# Patient Record
Sex: Female | Born: 1995
Health system: Southern US, Community
[De-identification: ages and names within clinical notes are randomized; demographics above are authoritative.]

## PROBLEM LIST (undated history)

## (undated) DIAGNOSIS — K8020 Calculus of gallbladder without cholecystitis without obstruction: Secondary | ICD-10-CM

## (undated) DIAGNOSIS — O24419 Gestational diabetes mellitus in pregnancy, unspecified control: Secondary | ICD-10-CM

## (undated) HISTORY — DX: Gestational diabetes mellitus in pregnancy, unspecified control: O24.419

## (undated) HISTORY — DX: Calculus of gallbladder without cholecystitis without obstruction: K80.20

## (undated) HISTORY — PX: WISDOM TOOTH EXTRACTION: SHX21

---

## 2015-05-06 ENCOUNTER — Emergency Department (HOSPITAL_COMMUNITY): Payer: Managed Care, Other (non HMO)

## 2015-05-06 ENCOUNTER — Encounter (HOSPITAL_COMMUNITY): Payer: Self-pay | Admitting: Emergency Medicine

## 2015-05-06 ENCOUNTER — Inpatient Hospital Stay (HOSPITAL_COMMUNITY)
Admission: EM | Admit: 2015-05-06 | Discharge: 2015-05-10 | DRG: 439 | Disposition: A | Discharge status: Home / Self Care | Payer: Managed Care, Other (non HMO) | Attending: Student in an Organized Health Care Education/Training Program | Admitting: Student in an Organized Health Care Education/Training Program

## 2015-05-06 DIAGNOSIS — R7989 Other specified abnormal findings of blood chemistry: Secondary | ICD-10-CM

## 2015-05-06 DIAGNOSIS — R945 Abnormal results of liver function studies: Secondary | ICD-10-CM

## 2015-05-06 DIAGNOSIS — N3001 Acute cystitis with hematuria: Secondary | ICD-10-CM

## 2015-05-06 DIAGNOSIS — R17 Unspecified jaundice: Secondary | ICD-10-CM

## 2015-05-06 DIAGNOSIS — R000 Tachycardia, unspecified: Secondary | ICD-10-CM

## 2015-05-06 DIAGNOSIS — K851 Biliary acute pancreatitis without necrosis or infection: Secondary | ICD-10-CM | POA: Diagnosis not present

## 2015-05-06 DIAGNOSIS — B964 Proteus (mirabilis) (morganii) as the cause of diseases classified elsewhere: Secondary | ICD-10-CM | POA: Diagnosis present

## 2015-05-06 DIAGNOSIS — R0682 Tachypnea, not elsewhere classified: Secondary | ICD-10-CM

## 2015-05-06 DIAGNOSIS — K859 Acute pancreatitis without necrosis or infection, unspecified: Secondary | ICD-10-CM

## 2015-05-06 DIAGNOSIS — K8050 Calculus of bile duct without cholangitis or cholecystitis without obstruction: Secondary | ICD-10-CM

## 2015-05-06 DIAGNOSIS — R748 Abnormal levels of other serum enzymes: Secondary | ICD-10-CM

## 2015-05-06 DIAGNOSIS — R509 Fever, unspecified: Secondary | ICD-10-CM

## 2015-05-06 DIAGNOSIS — K858 Other acute pancreatitis without necrosis or infection: Principal | ICD-10-CM | POA: Diagnosis present

## 2015-05-06 LAB — CBC WITH DIFFERENTIAL/PLATELET
BASOS ABS: 0 10*3/uL (ref 0.0–0.1)
BASOS PCT: 0 %
EOS PCT: 0 %
Eosinophils Absolute: 0 10*3/uL (ref 0.0–0.7)
HEMATOCRIT: 34 % — AB (ref 36.0–46.0)
HEMOGLOBIN: 12.3 g/dL (ref 12.0–15.0)
LYMPHS PCT: 9 %
Lymphs Abs: 1.2 10*3/uL (ref 0.7–4.0)
MCH: 34.3 pg — ABNORMAL HIGH (ref 26.0–34.0)
MCHC: 36.2 g/dL — AB (ref 30.0–36.0)
MCV: 94.7 fL (ref 78.0–100.0)
MONOS PCT: 10 %
Monocytes Absolute: 1.4 10*3/uL — ABNORMAL HIGH (ref 0.1–1.0)
NEUTROS ABS: 11.2 10*3/uL — AB (ref 1.7–7.7)
Neutrophils Relative %: 81 %
Platelets: 255 10*3/uL (ref 150–400)
RBC: 3.59 MIL/uL — ABNORMAL LOW (ref 3.87–5.11)
RDW: 11.3 % — ABNORMAL LOW (ref 11.5–15.5)
WBC: 13.8 10*3/uL — ABNORMAL HIGH (ref 4.0–10.5)

## 2015-05-06 LAB — COMPREHENSIVE METABOLIC PANEL
ALBUMIN: 2.7 g/dL — AB (ref 3.5–5.0)
ALK PHOS: 129 U/L — AB (ref 38–126)
ALT: 92 U/L — ABNORMAL HIGH (ref 14–54)
ANION GAP: 9 (ref 5–15)
AST: 74 U/L — ABNORMAL HIGH (ref 15–41)
BILIRUBIN TOTAL: 5.8 mg/dL — AB (ref 0.3–1.2)
BUN: 6 mg/dL (ref 6–20)
CALCIUM: 8.9 mg/dL (ref 8.9–10.3)
CO2: 23 mmol/L (ref 22–32)
Chloride: 104 mmol/L (ref 101–111)
Creatinine, Ser: 0.96 mg/dL (ref 0.44–1.00)
GFR calc Af Amer: >60 mL/min (ref >60)
GFR calc non Af Amer: >60 mL/min (ref >60)
GLUCOSE: 109 mg/dL — AB (ref 65–99)
Potassium: 3 mmol/L — ABNORMAL LOW (ref 3.5–5.1)
Sodium: 136 mmol/L (ref 135–145)
Total Protein: 7.6 g/dL (ref 6.5–8.1)

## 2015-05-06 LAB — URINALYSIS, ROUTINE W REFLEX MICROSCOPIC
GLUCOSE, UA: NEGATIVE mg/dL
Ketones, ur: 40 mg/dL — AB
Nitrite: POSITIVE — AB
PH: 5.5 (ref 5.0–8.0)
Protein, ur: >300 mg/dL — AB

## 2015-05-06 LAB — URINE MICROSCOPIC-ADD ON

## 2015-05-06 LAB — I-STAT CG4 LACTIC ACID, ED
Lactic Acid, Venous: 0.52 mmol/L (ref 0.5–2.0)
Lactic Acid, Venous: 0.68 mmol/L (ref 0.5–2.0)

## 2015-05-06 LAB — LIPID PANEL
CHOL/HDL RATIO: 5 ratio
Cholesterol: 149 mg/dL (ref 0–200)
HDL: 30 mg/dL — ABNORMAL LOW (ref >40)
LDL CALC: 104 mg/dL — AB (ref 0–99)
Triglycerides: 76 mg/dL (ref <150)
VLDL: 15 mg/dL (ref 0–40)

## 2015-05-06 LAB — MRSA PCR SCREENING: MRSA by PCR: NEGATIVE

## 2015-05-06 LAB — LIPASE, BLOOD: Lipase: 886 U/L — ABNORMAL HIGH (ref 11–51)

## 2015-05-06 LAB — INFLUENZA PANEL BY PCR (TYPE A & B)
H1N1FLUPCR: NOT DETECTED
INFLBPCR: NEGATIVE
Influenza A By PCR: NEGATIVE

## 2015-05-06 LAB — POC URINE PREG, ED: Preg Test, Ur: NEGATIVE

## 2015-05-06 MED ORDER — ONDANSETRON HCL 4 MG/2ML IJ SOLN
4.0000 mg | Freq: Once | INTRAMUSCULAR | Status: AC
  Administered 2015-05-06: 4 mg via INTRAVENOUS
  Filled 2015-05-06: qty 2

## 2015-05-06 MED ORDER — ONDANSETRON 4 MG PO TBDP
ORAL_TABLET | ORAL | Status: AC
  Filled 2015-05-06: qty 1

## 2015-05-06 MED ORDER — DEXTROSE 5 % IV SOLN
1.0000 g | INTRAVENOUS | Status: DC
  Administered 2015-05-07 – 2015-05-08 (×2): 1 g via INTRAVENOUS
  Filled 2015-05-06 (×4): qty 10

## 2015-05-06 MED ORDER — HYDROMORPHONE HCL 1 MG/ML IJ SOLN
0.2500 mg | INTRAMUSCULAR | Status: DC | PRN
  Administered 2015-05-06 – 2015-05-09 (×11): 0.25 mg via INTRAVENOUS
  Filled 2015-05-06 (×11): qty 1

## 2015-05-06 MED ORDER — POTASSIUM CHLORIDE 10 MEQ/100ML IV SOLN
10.0000 meq | INTRAVENOUS | Status: DC
  Administered 2015-05-06 (×4): 10 meq via INTRAVENOUS
  Filled 2015-05-06 (×5): qty 100

## 2015-05-06 MED ORDER — ACETAMINOPHEN 650 MG RE SUPP: 325.0000 mg | RECTAL | Status: DC | PRN

## 2015-05-06 MED ORDER — ONDANSETRON HCL 4 MG/2ML IJ SOLN
4.0000 mg | Freq: Four times a day (QID) | INTRAMUSCULAR | Status: DC | PRN
  Administered 2015-05-06 – 2015-05-08 (×2): 4 mg via INTRAVENOUS
  Filled 2015-05-06 (×3): qty 2

## 2015-05-06 MED ORDER — ONDANSETRON HCL 4 MG PO TABS
4.0000 mg | ORAL_TABLET | Freq: Four times a day (QID) | ORAL | Status: DC | PRN
  Administered 2015-05-09: 4 mg via ORAL
  Filled 2015-05-06: qty 1

## 2015-05-06 MED ORDER — ENOXAPARIN SODIUM 40 MG/0.4ML ~~LOC~~ SOLN: 40.0000 mg | SUBCUTANEOUS | Status: DC

## 2015-05-06 MED ORDER — SODIUM CHLORIDE 0.9 % IV BOLUS (SEPSIS)
1000.0000 mL | Freq: Once | INTRAVENOUS | Status: AC
  Administered 2015-05-06: 1000 mL via INTRAVENOUS

## 2015-05-06 MED ORDER — DEXTROSE 5 % IV SOLN
1.0000 g | Freq: Once | INTRAVENOUS | Status: AC
  Administered 2015-05-06: 1 g via INTRAVENOUS
  Filled 2015-05-06: qty 10

## 2015-05-06 MED ORDER — SODIUM CHLORIDE 0.9 % IV BOLUS (SEPSIS)
500.0000 mL | Freq: Once | INTRAVENOUS | Status: AC
  Administered 2015-05-06: 500 mL via INTRAVENOUS

## 2015-05-06 MED ORDER — MORPHINE SULFATE (PF) 4 MG/ML IV SOLN
4.0000 mg | Freq: Once | INTRAVENOUS | Status: AC
  Administered 2015-05-06: 4 mg via INTRAVENOUS
  Filled 2015-05-06: qty 1

## 2015-05-06 MED ORDER — ONDANSETRON 4 MG PO TBDP
4.0000 mg | ORAL_TABLET | Freq: Once | ORAL | Status: AC
  Administered 2015-05-06: 4 mg via ORAL

## 2015-05-06 MED ORDER — SODIUM CHLORIDE 0.9 % IV SOLN
INTRAVENOUS | Status: DC
  Administered 2015-05-06: 14:00:00 via INTRAVENOUS

## 2015-05-06 MED ORDER — HYDROMORPHONE HCL 1 MG/ML IJ SOLN
1.0000 mg | Freq: Once | INTRAMUSCULAR | Status: AC
  Administered 2015-05-06: 1 mg via INTRAVENOUS
  Filled 2015-05-06: qty 1

## 2015-05-06 MED ORDER — KETOROLAC TROMETHAMINE 15 MG/ML IJ SOLN
15.0000 mg | Freq: Four times a day (QID) | INTRAMUSCULAR | Status: DC | PRN
  Administered 2015-05-07 – 2015-05-09 (×3): 15 mg via INTRAVENOUS
  Filled 2015-05-06 (×6): qty 1

## 2015-05-06 MED ORDER — IOHEXOL 300 MG/ML  SOLN
100.0000 mL | Freq: Once | INTRAMUSCULAR | Status: AC | PRN
  Administered 2015-05-06: 100 mL via INTRAVENOUS

## 2015-05-06 NOTE — ED Provider Notes (Signed)
CSN: [id number]     Arrival date & time 05/06/15  0027 History   By signing my name below, I, Arlan Organ, attest that this documentation has been prepared under the direction and in the presence of Gilda Crease, MD.  Electronically Signed: Arlan Organ, ED Scribe. 05/06/2015. 3:04 AM.   Chief Complaint  Patient presents with  . Emesis   The history is provided by the patient. No language interpreter was used.    HPI Comments: Emily Mann is a 19 y.o. female without any pertinent past medical history who presents to the Emergency Department complaining of constant, ongoing nausea and vomiting x 2 days. Pt states she is unable to keep any solid foods or liquids down at this time. She also reports associated abdominal pain and diarrhea. No aggravating or alleviating factors at this time. No OTC medications or home remedies attempted prior to arrival. No recent fever, chills, chest pain, or shortness of breath. She denies any dysuria or hematuria. No recent known sick contacts. No previous history of abdominal surgeries.  PCP: No primary care provider on file.    History reviewed. No pertinent past medical history. Past Surgical History  Procedure Laterality Date  . Wisdom tooth extraction     No family history on file. Social History  Substance Use Topics  . Smoking status: Never Smoker   . Smokeless tobacco: None  . Alcohol Use: No   OB History    No data available     Review of Systems  Constitutional: Negative for fever and chills.  Respiratory: Negative for cough and shortness of breath.   Cardiovascular: Negative for chest pain.  Gastrointestinal: Positive for nausea, vomiting, abdominal pain and diarrhea.  Genitourinary: Negative for dysuria and hematuria.  Musculoskeletal: Negative for back pain.  Neurological: Negative for headaches.  Psychiatric/Behavioral: Negative for confusion.  All other systems reviewed and are negative.     Allergies  Review  of patient's allergies indicates not on file.  Home Medications   Prior to Admission medications   Not on File   Triage Vitals: BP 112/73 mmHg  Pulse 86  Temp(Src) 99.3 F (37.4 C)  Resp 20  Ht  (1.6 m)  Wt 257 lb (116.574 kg)  BMI 45.54 kg/m2  SpO2 96%  LMP 04/06/2015   Physical Exam  Constitutional: She is oriented to person, place, and time. She appears well-developed and well-nourished. No distress.  HENT:  Head: Normocephalic and atraumatic.  Right Ear: Hearing normal.  Left Ear: Hearing normal.  Nose: Nose normal.  Mouth/Throat: Oropharynx is clear and moist and mucous membranes are normal.  Eyes: Conjunctivae and EOM are normal. Pupils are equal, round, and reactive to light.  Neck: Normal range of motion. Neck supple.  Cardiovascular: Regular rhythm, S1 normal and S2 normal.  Exam reveals no gallop and no friction rub.   No murmur heard. Pulmonary/Chest: Effort normal and breath sounds normal. No respiratory distress. She exhibits no tenderness.  Abdominal: Soft. Normal appearance and bowel sounds are normal. There is no hepatosplenomegaly. There is tenderness. There is no rebound, no guarding, no tenderness at McBurney's point and negative Murphy's sign. No hernia.  Tenderness to RUQ and epigastrium   Musculoskeletal: Normal range of motion.  Neurological: She is alert and oriented to person, place, and time. She has normal strength. No cranial nerve deficit or sensory deficit. Coordination normal. GCS eye subscore is 4. GCS verbal subscore is 5. GCS motor subscore is 6.  Skin: Skin is warm,  dry and intact. No rash noted. No cyanosis.  Psychiatric: She has a normal mood and affect. Her speech is normal and behavior is normal. Thought content normal.  Nursing note and vitals reviewed.   ED Course  Procedures (including critical care time)  DIAGNOSTIC STUDIES: Oxygen Saturation is 96% on RA, adequate by my interpretation.    COORDINATION OF CARE: 2:53 AM-  Will give fluids and Zofran. Will order DG abd acute with chest, US abdomen limited RUQ, CBC, CMP, urinalysis, and urine pregnancy. Discussed treatment plan with pt at bedside and pt agreed to plan.     Labs Review Labs Reviewed  CBC WITH DIFFERENTIAL/PLATELET - Abnormal; Notable for the following:    WBC 13.8 (*)    RBC 3.59 (*)    HCT 34.0 (*)    MCH 34.3 (*)    MCHC 36.2 (*)    RDW 11.3 (*)    Neutro Abs 11.2 (*)    Monocytes Absolute 1.4 (*)    All other components within normal limits  COMPREHENSIVE METABOLIC PANEL - Abnormal; Notable for the following:    Potassium 3.0 (*)    Glucose, Bld 109 (*)    Albumin 2.7 (*)    AST 74 (*)    ALT 92 (*)    Alkaline Phosphatase 129 (*)    Total Bilirubin 5.8 (*)    All other components within normal limits  URINALYSIS, ROUTINE W REFLEX MICROSCOPIC (NOT AT [facility]) - Abnormal; Notable for the following:    Color, Urine RED (*)    APPearance TURBID (*)    Specific Gravity, Urine >1.046 (*)    Hgb urine dipstick LARGE (*)    Bilirubin Urine LARGE (*)    Ketones, ur 40 (*)    Protein, ur >300 (*)    Nitrite POSITIVE (*)    Leukocytes, UA MODERATE (*)    All other components within normal limits  URINE MICROSCOPIC-ADD ON - Abnormal; Notable for the following:    Squamous Epithelial / LPF 6-30 (*)    Bacteria, UA MANY (*)    Casts GRANULAR CAST (*)    All other components within normal limits  POC URINE PREG, ED    Imaging Review No results found. I have personally reviewed and evaluated these images and lab results as part of my medical decision-making.   EKG Interpretation None      MDM   Final diagnoses:  None   abdominal pain, nausea, vomiting  Presents to the emergency department for evaluation of abdominal pain, nausea, vomiting, diarrhea. Patient reported diffuse abdominal pain in the periumbilical region initially. Examination, however, revealed diffuse tenderness without focality in the right upper quadrant. Patient  was also found to have abnormal LFTs. Ultrasound performed. There is evidence of possible intraductal dilatation without obvious extra ductal dilatation. CT scan recommended. Patient will undergo CT scan, case signed out to oncoming ER physician to follow-up on CT and disposition patient.  I personally performed the services described in this documentation, which was scribed in my presence. The recorded information has been reviewed and is accurate.    Gilda Creasehristopher J Dorisann Schwanke, MD 05/06/15 [phone]

## 2015-05-06 NOTE — ED Notes (Signed)
Pt has temp 103.3 oral. Krall,MD notified.

## 2015-05-06 NOTE — ED Notes (Signed)
This RN called Dr. Isabella BowensKrall to advise pt's HR trending up to 140's.  New order received for 1L NS bolus.

## 2015-05-06 NOTE — H&P (Signed)
Date: 05/06/2015               Patient Name:  Emily Mann MRN: [id number]  DOB: [birth year] Age / Sex: 19 y.o., female   PCP: No primary care provider on file.         Medical Service: Internal Medicine Teaching Service         Attending Physician: Dr. Tyson Aliasuncan Thomas Vincent, MD    First Contact: Dr. Reubin MilanBilly Marquia Costello Pager: [phone]  Second Contact: Dr. Griffin BasilJennifer Krall Pager: [phone]       After Hours (After 5p/  First Contact Pager: [phone]  weekends / holidays): Second Contact Pager: [phone]   Chief Complaint: Abdominal pain and vomiting  History of Present Illness: Emily Mann is a 19yo F with no significant PMHx who presents with a 2-day history of generalized crampy abdominal pain with vomiting. She was in her usual state of health until 4 days ago, when she started having URI-like symptoms with subjective fevers, occasionally productive cough with chest congestion, and nausea. Then, 2 days ago, she started having worsening generalized crampy abdominal pain with nausea, vomiting, and diarrhea without association with meals, which did not remit and brought her to the ED. She has not tried anything OTC for her symptoms, and nothing seems to worsen or improve the symptoms. She also has generalized malaise. Otherwise, she denies other symptoms, such as diaphoresis, vision changes, headache, chest pain, shortness of breath, palpitations, abdominal distension, rashes, leg swelling, numbness, weakness, changes in urination, rhinorrhea, sore throat, sick contacts, recent travel, or other issues at this time. Her last menstrual period was 4 weeks ago, cycles are regular q 4-5 weeks. She is not taking OCPs or any other medicines.  Meds: No current facility-administered medications for this encounter.   No current outpatient prescriptions on file.    Allergies: Allergies as of 05/06/2015  . (No Known Allergies)   History reviewed. No pertinent past medical history. Past Surgical History    Procedure Laterality Date  . Wisdom tooth extraction     Family History  Problem Relation Age of Onset  . Liver cancer Maternal Grandmother   . Liver disease Brother    Social History   Social History  . Marital Status: Single    Spouse Name: N/A  . Number of Children: N/A  . Years of Education: N/A   Occupational History  . Product managerrinting press worker    Social History Main Topics  . Smoking status: Never Smoker   . Smokeless tobacco: Never Used  . Alcohol Use: 0.6 - 1.2 oz/week    1-2 Standard drinks or equivalent per week  . Drug Use: No  . Sexual Activity:    Partners: Female   Other Topics Concern  . Not on file   Social History Narrative    Review of Systems: Pertinent items noted in HPI and 10 systems reviewed with remainder of comprehensive ROS otherwise negative.  Physical Exam: Blood pressure 132/78, pulse 131, temperature 102.4 F (39.1 C), temperature source Rectal, resp. rate 20, height 5\' 3"  (1.6 m), weight 257 lb (116.574 kg), last menstrual period 04/06/2015, SpO2 99 %.   Gen: Well-appearing, alert and oriented to person, place, and time HEENT: Oropharynx clear without erythema or exudate.  Neck: No cervical LAD, no thyromegaly or nodules, no JVD noted. CV: Normal rate, regular rhythm, no murmurs, rubs, or gallops Pulmonary: Normal effort, CTA bilaterally, no wheezing, rales, or rhonchi Abdominal: Soft abdomen, moderate tenderness in the epigastrum and RUQ, no rebound,  guarding or masses, Murphy's negative. Extremities: Distal pulses 2+ in upper and lower extremities bilaterally, no tenderness, erythema or edema Skin: No atypical appearing moles. No rashes   Lab results: Basic Metabolic Panel:  Recent Labs  05/06/15 0055 16/02/9635  K 3.0*  CL 104  CO2 23  GLUCOSE 109*  BUN 6  CREATININE 0.96  CALCIUM 8.9   Liver Function Tests:  Recent Labs  05/06/15 0055  AST 74*  ALT 92*  ALKPHOS 129*  BILITOT 5.8*  PROT 7.6  ALBUMIN 2.7*     Recent Labs  05/06/15 0055  LIPASE 886*   CBC:  Recent Labs  05/06/15 0055  WBC 13.8*  NEUTROABS 11.2*  HGB 12.3  HCT 34.0*  MCV 94.7  PLT 255   Fasting Lipid Panel:  Recent Labs  05/06/15 1006  CHOL 149  HDL 30*  LDLCALC 104*  TRIG 76  CHOLHDL 5.0   Urinalysis:  Recent Labs  05/06/15 0056  COLORURINE RED*  LABSPEC >1.046*  PHURINE 5.5  GLUCOSEU NEGATIVE  HGBUR LARGE*  BILIRUBINUR LARGE*  KETONESUR 40*  PROTEINUR >300*  NITRITE POSITIVE*  LEUKOCYTESUR MODERATE*   Imaging results:  Ct Abdomen Pelvis W Contrast  05/06/2015  ADDENDUM REPORT: 05/06/2015 11:15 ADDENDUM: The impression should read: Changes consistent with mild pancreatitis. Correlation with laboratory values is recommended. Tiny subpleural nodules as described. Electronically Signed   By: Alcide Clever M.D.   On: 05/06/2015 11:15  05/06/2015  CLINICAL DATA:  Abdominal pain with nausea and vomiting EXAM: CT ABDOMEN AND PELVIS WITH CONTRAST TECHNIQUE: Multidetector CT imaging of the abdomen and pelvis was performed using the standard protocol following bolus administration of intravenous contrast. CONTRAST:  OMNIPAQUE IOHEXOL 300 MG/ML  SOLN COMPARISON:  None. FINDINGS: Lung bases are free of acute infiltrate or sizable effusion. Tiny subpleural nodules are identified bilaterally. The liver, spleen, adrenal glands, gallbladder and kidneys are within normal limits. Pancreas is well visualized and demonstrates some peripancreatic inflammatory changes predominately within the head and uncinate process region. The appendix is within normal limits. The bladder is well distended. No acute bony abnormality is noted. IMPRESSION: No acute intra-abdominal abnormality is noted. Tiny subpleural nodules bilaterally. These are likely postinflammatory in nature. If the patient is at high risk for bronchogenic carcinoma, follow-up chest CT at 1 year is recommended. If the patient is at low risk, no follow-up is  needed. This recommendation follows the consensus statement: Guidelines for Management of Small Pulmonary Nodules Detected on CT Scans: A Statement from the Fleischner Society as published in Radiology 2005; 237:395-400. Electronically Signed: By: Alcide Clever M.D. On: 05/06/2015 08:24   Dg Abd Acute W/chest  05/06/2015  CLINICAL DATA:  Centralized abdominal pain, nausea, diarrhea, and vomiting for 2 days. EXAM: DG ABDOMEN ACUTE W/ 1V CHEST COMPARISON:  None. FINDINGS: Shallow inspiration with atelectasis in the lung bases. Normal heart size and pulmonary vascularity. No focal airspace disease or consolidation in the lungs. No blunting of costophrenic angles. No pneumothorax. Mediastinal contours appear intact. Scattered gas and stool in the colon. No small or large bowel distention. No free intra-abdominal air. No abnormal air-fluid levels. No radiopaque stones. Visualized bones appear intact. IMPRESSION: Negative abdominal radiographs.  No acute cardiopulmonary disease. Electronically Signed   By: Burman Nieves M.D.   On: 05/06/2015 03:55   US Abdomen Limited Ruq  05/06/2015  CLINICAL DATA:  Right upper quadrant pain, nausea, vomiting, and diarrhea for 3 days. Elevated liver function tests. EXAM: US ABDOMEN LIMITED -  RIGHT UPPER QUADRANT COMPARISON:  None. FINDINGS: Gallbladder: No gallstones or wall thickening visualized. No sonographic Murphy sign noted. Common bile duct: Diameter: 7.2 mm, upper limits of normal. Liver: Visualization is limited due to patient movement and rib shadowing. Suggestion of heterogeneous increased parenchymal echotexture probably due to fatty infiltration. Possible intrahepatic bile duct dilatation. Given history of elevated liver function studies, CT examination of the liver is suggested. IMPRESSION: Heterogeneous parenchymal echotexture changes in the liver with suggestion of intrahepatic bile duct dilatation. Extrahepatic bile ducts are upper limits of normal. Gallbladder  is normal. Consider CT for further evaluation. Electronically Signed   By: Burman Nieves M.D.   On: 05/06/2015 04:08   Assessment & Plan by Problem: Active Problems:   * No active hospital problems. * 1. Pancreatitis 2/2 common bile duct stone - 2 day h/o abdominal pain, N/V/D preceded by viral prodrome. Initially febrile here, otherwise hemodynamically stable. Labs show WBC 14, lipase 886 with elevated ALP, AST, ALT and bilirubin concerning for pancreatitis 2/2 biliary obstruction. Abd US shows possible intrahepatic duct dilation with CBD not markedly dilated but upper limit of normal, gallbladder shows no signs of cholelithiasis. CT abdomen shows changes consistent with mild pancreatitis. Most likely caused by passing common bile duct stone. -NPO -IV fluids -Rocephin IV -Follow-up blood cultures, urine cultures -Zofran  q6hrs PRN for N/V, dilaudid 0.25mg  IV q3hrs PRN for pain -Will consult GI as pancreatitis improves for possible ERCP  2. Hematuria on UA - patient denies any urinary symptoms, including gross hematuria, however there is hematuria on UA. Pt says LMP was 4 weeks ago, may be starting period again which could explain our findings. UTI less likely but still a possibility given many bacteria, positive nitrites, moderate leukocytes although UA dirty.  -Continue rocephin for now -F/u urine cultures -Continue to monitor clinically  PPX - DVT: Lovenox  Dispo: Disposition is deferred at this time, awaiting improvement of current medical problems. Anticipated discharge in approximately 1-3 day(s).   The patient does not have a current PCP (No primary care provider on file.) and does need an [facility] hospital follow-up appointment after discharge.  The patient does not have transportation limitations that hinder transportation to clinic appointments.  Signed: Darrick Huntsman, MD 05/06/2015, 12:00 PM

## 2015-05-06 NOTE — ED Provider Notes (Signed)
8:42 AM Filed Vitals:   05/06/15 0823 05/06/15 0838  BP:    Pulse: 121   Temp:  102.4 F (39.1 C)  Resp:     Patient remains febrile with an elevated pulse rate of 121 despite 1 L IV fluids.  She will be given ibuprofen for fever, additional IVFs now. Will add lipase and lactic acid. Given rocephin to cover possible UTI (nitrite positive). Urine culture sent. Pt is without a primary care physician.  Much of this is likely viral given her nausea vomiting and diarrhea but without a primary care physician to follow-up on her elevated LFTs and bilirubin I would be worried that she will get lost to follow-up.  I think she'll benefit from ongoing IV hydration in the hospital as well as ongoing evaluation of her LFTs to make sure these returned to normal after her acute illness.  No obvious abnormalities noted on CT abdomen pelvis.  Mild intrahepatic ductal dilatation on ultrasound without evidence of common bile duct dilatation.  No cholelithiasis noted.  Patient informed.  I personally reviewed the imaging tests through PACS system I reviewed available ER/hospitalization records through the EMR   Azalia BilisKevin Jourdyn Ferrin, MD 05/06/15 [phone]

## 2015-05-06 NOTE — ED Notes (Signed)
Admitting MD at bedside.

## 2015-05-06 NOTE — ED Notes (Signed)
Attempted to call report x1 unsuccessful

## 2015-05-06 NOTE — ED Notes (Signed)
Pt desating after hydromorphone, placed on 2L

## 2015-05-06 NOTE — ED Notes (Signed)
Pt st's she has had nausea, vomiting and diarrhea x's 3 days. Also elevated temp  St's unable to keep anything down.  St's she has taken Thera Flu and Tylenol without relief.

## 2015-05-06 NOTE — ED Notes (Signed)
Patient transported to CT 

## 2015-05-07 ENCOUNTER — Inpatient Hospital Stay (HOSPITAL_COMMUNITY): Payer: Managed Care, Other (non HMO)

## 2015-05-07 ENCOUNTER — Encounter (HOSPITAL_COMMUNITY): Payer: Self-pay | Admitting: Gastroenterology

## 2015-05-07 DIAGNOSIS — K859 Acute pancreatitis without necrosis or infection, unspecified: Secondary | ICD-10-CM | POA: Diagnosis present

## 2015-05-07 DIAGNOSIS — K851 Biliary acute pancreatitis without necrosis or infection: Secondary | ICD-10-CM | POA: Insufficient documentation

## 2015-05-07 LAB — COMPREHENSIVE METABOLIC PANEL
ALBUMIN: 2.1 g/dL — AB (ref 3.5–5.0)
ALK PHOS: 96 U/L (ref 38–126)
ALT: 59 U/L — AB (ref 14–54)
AST: 43 U/L — ABNORMAL HIGH (ref 15–41)
Anion gap: 9 (ref 5–15)
BUN: 6 mg/dL (ref 6–20)
CALCIUM: 7.9 mg/dL — AB (ref 8.9–10.3)
CO2: 20 mmol/L — AB (ref 22–32)
CREATININE: 0.9 mg/dL (ref 0.44–1.00)
Chloride: 109 mmol/L (ref 101–111)
GFR calc non Af Amer: >60 mL/min (ref >60)
GLUCOSE: 86 mg/dL (ref 65–99)
Potassium: 3.4 mmol/L — ABNORMAL LOW (ref 3.5–5.1)
SODIUM: 138 mmol/L (ref 135–145)
Total Bilirubin: 5.1 mg/dL — ABNORMAL HIGH (ref 0.3–1.2)
Total Protein: 6.1 g/dL — ABNORMAL LOW (ref 6.5–8.1)

## 2015-05-07 LAB — CBC
HCT: 28.1 % — ABNORMAL LOW (ref 36.0–46.0)
Hemoglobin: 9.9 g/dL — ABNORMAL LOW (ref 12.0–15.0)
MCH: 34.6 pg — AB (ref 26.0–34.0)
MCHC: 35.2 g/dL (ref 30.0–36.0)
MCV: 98.3 fL (ref 78.0–100.0)
PLATELETS: 191 10*3/uL (ref 150–400)
RBC: 2.86 MIL/uL — ABNORMAL LOW (ref 3.87–5.11)
RDW: 11.8 % (ref 11.5–15.5)
WBC: 13.9 10*3/uL — ABNORMAL HIGH (ref 4.0–10.5)

## 2015-05-07 MED ORDER — SODIUM CHLORIDE 0.9 % IV BOLUS (SEPSIS)
1000.0000 mL | Freq: Once | INTRAVENOUS | Status: AC
  Administered 2015-05-07: 1000 mL via INTRAVENOUS

## 2015-05-07 MED ORDER — ACETAMINOPHEN 325 MG PO TABS
650.0000 mg | ORAL_TABLET | Freq: Four times a day (QID) | ORAL | Status: DC | PRN
  Administered 2015-05-07 – 2015-05-08 (×2): 650 mg via ORAL
  Filled 2015-05-07 (×2): qty 2

## 2015-05-07 MED ORDER — INFLUENZA VAC SPLIT QUAD 0.5 ML IM SUSY
0.5000 mL | PREFILLED_SYRINGE | INTRAMUSCULAR | Status: DC
  Filled 2015-05-07: qty 0.5

## 2015-05-07 MED ORDER — DEXTROSE-NACL 5-0.45 % IV SOLN
INTRAVENOUS | Status: DC
  Administered 2015-05-07: 09:00:00 via INTRAVENOUS
  Administered 2015-05-08: 1000 mL via INTRAVENOUS
  Administered 2015-05-08 (×2): via INTRAVENOUS
  Administered 2015-05-09: 1000 mL via INTRAVENOUS

## 2015-05-07 MED ORDER — OXYCODONE-ACETAMINOPHEN 5-325 MG PO TABS
1.0000 | ORAL_TABLET | Freq: Four times a day (QID) | ORAL | Status: DC | PRN
  Administered 2015-05-07 – 2015-05-08 (×3): 2 via ORAL
  Filled 2015-05-07 (×3): qty 2

## 2015-05-07 MED ORDER — GADOBENATE DIMEGLUMINE 529 MG/ML IV SOLN
18.0000 mL | Freq: Once | INTRAVENOUS | Status: AC | PRN
  Administered 2015-05-07: 18 mL via INTRAVENOUS

## 2015-05-07 MED ORDER — ENOXAPARIN SODIUM 40 MG/0.4ML ~~LOC~~ SOLN
40.0000 mg | SUBCUTANEOUS | Status: DC
Start: 2015-05-07 — End: 2015-05-07

## 2015-05-07 MED ORDER — ENOXAPARIN SODIUM 40 MG/0.4ML ~~LOC~~ SOLN
40.0000 mg | SUBCUTANEOUS | Status: DC
  Administered 2015-05-07 – 2015-05-10 (×3): 40 mg via SUBCUTANEOUS
  Filled 2015-05-07 (×4): qty 0.4

## 2015-05-07 NOTE — Progress Notes (Signed)
Subjective: Emily Mann says her pain, nausea and vomiting are all improving significantly since yesterday. However, she does still appear in discomfort and she is still spiking fevers as well as tachypneic and tachycardic, up to the 140. Denies any chest pain, diaphoresis, palpitations, abdominal distension, changes in urinary or bowel function, or other symptoms at this time.  Objective: Vital signs in last 24 hours: Filed Vitals:   05/07/15 0800 05/07/15 0846 05/07/15 0900 05/07/15 1003  BP: 128/96 128/96 131/92   Pulse: 136 138 137   Temp:  101.6 F (38.7 C)  99.3 F (37.4 C)  TempSrc:  Oral  Oral  Resp: 38 32 29   Height:      Weight:      SpO2: 87% 91% 91%    Weight change: 1 lb 13.1 oz (0.826 kg)  Intake/Output Summary (Last 24 hours) at 05/07/15 1150 Last data filed at 05/07/15 0900  Gross per 24 hour  Intake 3632.5 ml  Output      0 ml  Net 3632.5 ml     Gen: Well-appearing, alert and oriented to person, place, and time HEENT: Oropharynx clear without erythema or exudate.  Neck: No cervical LAD, no thyromegaly or nodules, no JVD noted. CV: Tachycardic rate, regular rhythm, no murmurs, rubs, or gallops Pulmonary: Normal effort, CTA bilaterally, no wheezing, rales, or rhonchi Abdominal: Soft abdomen, moderate tenderness in the epigastrum and RUQ, no rebound, guarding or masses, Murphy's negative. Extremities: Distal pulses 2+ in upper and lower extremities bilaterally, no tenderness, erythema or edema Skin: No atypical appearing moles. No rashes  Lab Results: Basic Metabolic Panel:  Recent Labs Lab 05/06/15 0055 05/07/15 0330  NA 136 138  K 3.0* 3.4*  CL 104 109  CO2 23 20*  GLUCOSE 109* 86  BUN 6 6  CREATININE 0.96 0.90  CALCIUM 8.9 7.9*   Liver Function Tests:  Recent Labs Lab 05/06/15 0055 05/07/15 0330  AST 74* 43*  ALT 92* 59*  ALKPHOS 129* 96  BILITOT 5.8* 5.1*  PROT 7.6 6.1*  ALBUMIN 2.7* 2.1*    Recent Labs Lab 05/06/15 0055    LIPASE 886*   CBC:  Recent Labs Lab 05/06/15 0055 05/07/15 0330  WBC 13.8* 13.9*  NEUTROABS 11.2*  --   HGB 12.3 9.9*  HCT 34.0* 28.1*  MCV 94.7 98.3  PLT 255 191   Fasting Lipid Panel:  Recent Labs Lab 05/06/15 1006  CHOL 149  HDL 30*  LDLCALC 104*  TRIG 76  CHOLHDL 5.0   Urinalysis:  Recent Labs Lab 05/06/15 0056  COLORURINE RED*  LABSPEC >1.046*  PHURINE 5.5  GLUCOSEU NEGATIVE  HGBUR LARGE*  BILIRUBINUR LARGE*  KETONESUR 40*  PROTEINUR >300*  NITRITE POSITIVE*  LEUKOCYTESUR MODERATE*   Micro Results: Recent Results (from the past 240 hour(s))  MRSA PCR Screening     Status: None   Collection Time: 05/06/15  8:33 PM  Result Value Ref Range Status   MRSA by PCR NEGATIVE NEGATIVE Final    Comment:        The GeneXpert MRSA Assay (FDA approved for NASAL specimens only), is one component of a comprehensive MRSA colonization surveillance program. It is not intended to diagnose MRSA infection nor to guide or monitor treatment for MRSA infections.    Studies/Results: Ct Abdomen Pelvis W Contrast  05/06/2015  ADDENDUM REPORT: 05/06/2015 11:15 ADDENDUM: The impression should read: Changes consistent with mild pancreatitis. Correlation with laboratory values is recommended. Tiny subpleural nodules as described. Electronically Signed  By: Alcide Clever M.D.   On: 05/06/2015 11:15  05/06/2015  CLINICAL DATA:  Abdominal pain with nausea and vomiting EXAM: CT ABDOMEN AND PELVIS WITH CONTRAST TECHNIQUE: Multidetector CT imaging of the abdomen and pelvis was performed using the standard protocol following bolus administration of intravenous contrast. CONTRAST:  OMNIPAQUE IOHEXOL 300 MG/ML  SOLN COMPARISON:  None. FINDINGS: Lung bases are free of acute infiltrate or sizable effusion. Tiny subpleural nodules are identified bilaterally. The liver, spleen, adrenal glands, gallbladder and kidneys are within normal limits. Pancreas is well visualized and  demonstrates some peripancreatic inflammatory changes predominately within the head and uncinate process region. The appendix is within normal limits. The bladder is well distended. No acute bony abnormality is noted. IMPRESSION: No acute intra-abdominal abnormality is noted. Tiny subpleural nodules bilaterally. These are likely postinflammatory in nature. If the patient is at high risk for bronchogenic carcinoma, follow-up chest CT at 1 year is recommended. If the patient is at low risk, no follow-up is needed. This recommendation follows the consensus statement: Guidelines for Management of Small Pulmonary Nodules Detected on CT Scans: A Statement from the [employer] as published in Radiology 2005; 237:395-400. Electronically Signed: By: Alcide Clever M.D. On: 05/06/2015 08:24   Dg Chest Port 1 View  05/07/2015  CLINICAL DATA:  Tachypneic.  Congestion.  Fever. EXAM: PORTABLE CHEST 1 VIEW COMPARISON:  05/06/2015. FINDINGS: Heart size stable. Low lung volumes. Bilateral mild interstitial prominence suggesting mild interstitial pneumonitis. Low lung volumes with basilar atelectasis. No pleural effusion or pneumothorax IMPRESSION: 1. Bilateral mild interstitial prominence suggesting interstitial pneumonitis. 2. Low lung volumes with bibasilar atelectasis. Electronically Signed   By: Maisie Fus  Register   On: 05/07/2015 11:43   Dg Abd Acute W/chest  05/06/2015  CLINICAL DATA:  Centralized abdominal pain, nausea, diarrhea, and vomiting for 2 days. EXAM: DG ABDOMEN ACUTE W/ 1V CHEST COMPARISON:  None. FINDINGS: Shallow inspiration with atelectasis in the lung bases. Normal heart size and pulmonary vascularity. No focal airspace disease or consolidation in the lungs. No blunting of costophrenic angles. No pneumothorax. Mediastinal contours appear intact. Scattered gas and stool in the colon. No small or large bowel distention. No free intra-abdominal air. No abnormal air-fluid levels. No radiopaque stones.  Visualized bones appear intact. IMPRESSION: Negative abdominal radiographs.  No acute cardiopulmonary disease. Electronically Signed   By: Burman Nieves M.D.   On: 05/06/2015 03:55   US Abdomen Limited Ruq  05/06/2015  CLINICAL DATA:  Right upper quadrant pain, nausea, vomiting, and diarrhea for 3 days. Elevated liver function tests. EXAM: US ABDOMEN LIMITED - RIGHT UPPER QUADRANT COMPARISON:  None. FINDINGS: Gallbladder: No gallstones or wall thickening visualized. No sonographic Murphy sign noted. Common bile duct: Diameter: 7.2 mm, upper limits of normal. Liver: Visualization is limited due to patient movement and rib shadowing. Suggestion of heterogeneous increased parenchymal echotexture probably due to fatty infiltration. Possible intrahepatic bile duct dilatation. Given history of elevated liver function studies, CT examination of the liver is suggested. IMPRESSION: Heterogeneous parenchymal echotexture changes in the liver with suggestion of intrahepatic bile duct dilatation. Extrahepatic bile ducts are upper limits of normal. Gallbladder is normal. Consider CT for further evaluation. Electronically Signed   By: Burman Nieves M.D.   On: 05/06/2015 04:08  Assessment/Plan: Principal Problem:   Pancreatitis, acute Active Problems:   Acute gallstone pancreatitis 1. Pancreatitis 2/2 common bile duct stone - 2 day h/o abdominal pain, N/V/D preceded by viral prodrome. Initially febrile here, otherwise hemodynamically stable. Labs show  WBC 14, lipase 886 with elevated ALP, AST, ALT and bilirubin concerning for pancreatitis 2/2 biliary obstruction. Abd US shows possible intrahepatic duct dilation with CBD not markedly dilated but upper limit of normal, gallbladder shows no signs of cholelithiasis. CT abdomen shows changes consistent with mild pancreatitis. Most likely caused by passing common bile duct stone. -Consulted GI as she likely still has a biliary stone; appreciate recs -NPO -IV  fluids -CXR this am for tachypnea -Rocephin IV -Follow-up blood cultures, urine cultures, CBC, CMP -Zofran  q6hrs PRN for N/V, dilaudid 0.25mg  IV q3hrs PRN for pain  2. Hematuria on UA - patient denies any urinary symptoms, including gross hematuria, however there is hematuria on UA. Pt says LMP was 4 weeks ago, may be starting period again which could explain our findings. UTI still a possibility given many bacteria, positive nitrites, moderate leukocytes although UA dirty.  -Continue rocephin for now -F/u urine cultures -Continue to monitor clinically  PPX - DVT: Lovenox  Dispo: Disposition is deferred at this time, awaiting improvement of current medical problems.  Anticipated discharge in approximately 1-3 day(s).   The patient does not have a current PCP (No primary care provider on file.) and does need an [facility] hospital follow-up appointment after discharge.  The patient does not have transportation limitations that hinder transportation to clinic appointments.   LOS: 1 day   Darrick Huntsman, MD 05/07/2015, 11:50 AM

## 2015-05-07 NOTE — Consult Note (Signed)
Reason for Consult: Pancreatitis Referring Physician: Hospital team  Emily Mann is an 19 y.o. female.  HPI: Patient seen and examined and hospital computer chart reviewed and she has no prior GI history until the last few days with increased nausea vomiting and abdominal pain and she believes her urine spell little dark for about a week and gallstones and pancreatitis do not run in the family and she only drinks occasionally socially and denies any medicine or over-the-counter vitamins or herbs and is actually feeling better and has no new complaints and is tolerating clear liquids and did have a child for years ago but no other children  History reviewed. No pertinent past medical history.  Past Surgical History  Procedure Laterality Date  . Wisdom tooth extraction      Family History  Problem Relation Age of Onset  . Liver cancer Maternal Grandmother   . Liver disease Brother     Social History:  reports that she has never smoked. She has never used smokeless tobacco. She reports that she drinks about 0.6 - 1.2 oz of alcohol per week. She reports that she does not use illicit drugs.  Allergies: No Known Allergies  Medications: I have reviewed the patient's current medications.  Results for orders placed or performed during the hospital encounter of 05/06/15 (from the past 48 hour(s))  CBC with Differential     Status: Abnormal   Collection Time: 05/06/15 12:55 AM  Result Value Ref Range   WBC 13.8 (H) 4.0 - 10.5 K/uL   RBC 3.59 (L) 3.87 - 5.11 MIL/uL   Hemoglobin 12.3 12.0 - 15.0 g/dL   HCT 34.0 (L) 36.0 - 46.0 %   MCV 94.7 78.0 - 100.0 fL   MCH 34.3 (H) 26.0 - 34.0 pg   MCHC 36.2 (H) 30.0 - 36.0 g/dL   RDW 11.3 (L) 11.5 - 15.5 %   Platelets 255 150 - 400 K/uL   Neutrophils Relative % 81 %   Lymphocytes Relative 9 %   Monocytes Relative 10 %   Eosinophils Relative 0 %   Basophils Relative 0 %   Neutro Abs 11.2 (H) 1.7 - 7.7 K/uL   Lymphs Abs 1.2 0.7 - 4.0 K/uL    Monocytes Absolute 1.4 (H) 0.1 - 1.0 K/uL   Eosinophils Absolute 0.0 0.0 - 0.7 K/uL   Basophils Absolute 0.0 0.0 - 0.1 K/uL   Smear Review MORPHOLOGY UNREMARKABLE   Comprehensive metabolic panel     Status: Abnormal   Collection Time: 05/06/15 12:55 AM  Result Value Ref Range   Sodium 136 135 - 145 mmol/L   Potassium 3.0 (L) 3.5 - 5.1 mmol/L   Chloride 104 101 - 111 mmol/L   CO2 23 22 - 32 mmol/L   Glucose, Bld 109 (H) 65 - 99 mg/dL   BUN 6 6 - 20 mg/dL   Creatinine, Ser 0.96 0.44 - 1.00 mg/dL   Calcium 8.9 8.9 - 10.3 mg/dL   Total Protein 7.6 6.5 - 8.1 g/dL   Albumin 2.7 (L) 3.5 - 5.0 g/dL   AST 74 (H) 15 - 41 U/L   ALT 92 (H) 14 - 54 U/L   Alkaline Phosphatase 129 (H) 38 - 126 U/L   Total Bilirubin 5.8 (H) 0.3 - 1.2 mg/dL   GFR calc non Af Amer >60 >60 mL/min   GFR calc Af Amer >60 >60 mL/min    Comment: (NOTE) The eGFR has been calculated using the CKD EPI equation. This calculation has not  been validated in all clinical situations. eGFR's persistently <60 mL/min signify possible Chronic Kidney Disease.    Anion gap 9 5 - 15  Lipase, blood     Status: Abnormal   Collection Time: 05/06/15 12:55 AM  Result Value Ref Range   Lipase 886 (H) 11 - 51 U/L    Comment: RESULTS CONFIRMED BY MANUAL DILUTION  Urinalysis, Routine w reflex microscopic (not at [facility])     Status: Abnormal   Collection Time: 05/06/15 12:56 AM  Result Value Ref Range   Color, Urine RED (A) YELLOW    Comment: BIOCHEMICALS MAY BE AFFECTED BY COLOR   APPearance TURBID (A) CLEAR   Specific Gravity, Urine >1.046 (H) 1.005 - 1.030   pH 5.5 5.0 - 8.0   Glucose, UA NEGATIVE NEGATIVE mg/dL   Hgb urine dipstick LARGE (A) NEGATIVE   Bilirubin Urine LARGE (A) NEGATIVE   Ketones, ur 40 (A) NEGATIVE mg/dL   Protein, ur >300 (A) NEGATIVE mg/dL   Nitrite POSITIVE (A) NEGATIVE   Leukocytes, UA MODERATE (A) NEGATIVE  Urine microscopic-add on     Status: Abnormal   Collection Time: 05/06/15 12:56 AM  Result Value  Ref Range   Squamous Epithelial / LPF 6-30 (A) NONE SEEN   WBC, UA 6-30 0 - 5 WBC/hpf   RBC / HPF 0-5 0 - 5 RBC/hpf   Bacteria, UA MANY (A) NONE SEEN   Casts GRANULAR CAST (A) NEGATIVE    Comment: HYALINE CASTS   Urine-Other LESS THAN 10 mL OF URINE SUBMITTED     Comment: MUCOUS PRESENT  Urine culture     Status: None (Preliminary result)   Collection Time: 05/06/15 12:56 AM  Result Value Ref Range   Specimen Description URINE, RANDOM    Special Requests NONE    Culture >=100,000 COLONIES/mL PROTEUS MIRABILIS    Report Status PENDING   POC Urine Pregnancy, ED (do NOT order at [facility])     Status: None   Collection Time: 05/06/15  1:16 AM  Result Value Ref Range   Preg Test, Ur NEGATIVE NEGATIVE    Comment:        THE SENSITIVITY OF THIS METHODOLOGY IS >24 mIU/mL   I-Stat CG4 Lactic Acid, ED     Status: None   Collection Time: 05/06/15  8:56 AM  Result Value Ref Range   Lactic Acid, Venous 0.52 0.5 - 2.0 mmol/L  Lipid panel     Status: Abnormal   Collection Time: 05/06/15 10:06 AM  Result Value Ref Range   Cholesterol 149 0 - 200 mg/dL   Triglycerides 76 <150 mg/dL   HDL 30 (L) >40 mg/dL   Total CHOL/HDL Ratio 5.0 RATIO   VLDL 15 0 - 40 mg/dL   LDL Cholesterol 104 (H) 0 - 99 mg/dL    Comment:        Total Cholesterol/HDL:CHD Risk Coronary Heart Disease Risk Table                     Men   Women  1/2 Average Risk   3.4   3.3  Average Risk       5.0   4.4  2 X Average Risk   9.6   7.1  3 X Average Risk  23.4   11.0        Use the calculated Patient Ratio above and the CHD Risk Table to determine the patient's CHD Risk.        ATP III CLASSIFICATION (  LDL):  <100     mg/dL   Optimal  100-129  mg/dL   Near or Above                    Optimal  130-159  mg/dL   Borderline  160-189  mg/dL   High  >190     mg/dL   Very High   Influenza panel by PCR (type A & B, H1N1)     Status: None   Collection Time: 05/06/15 10:28 AM  Result Value Ref Range   Influenza A By PCR  NEGATIVE NEGATIVE   Influenza B By PCR NEGATIVE NEGATIVE   H1N1 flu by pcr NOT DETECTED NOT DETECTED    Comment:        The Xpert Flu assay (FDA approved for nasal aspirates or washes and nasopharyngeal swab specimens), is intended as an aid in the diagnosis of influenza and should not be used as a sole basis for treatment.   I-Stat CG4 Lactic Acid, ED     Status: None   Collection Time: 05/06/15 11:58 AM  Result Value Ref Range   Lactic Acid, Venous 0.68 0.5 - 2.0 mmol/L  MRSA PCR Screening     Status: None   Collection Time: 05/06/15  8:33 PM  Result Value Ref Range   MRSA by PCR NEGATIVE NEGATIVE    Comment:        The GeneXpert MRSA Assay (FDA approved for NASAL specimens only), is one component of a comprehensive MRSA colonization surveillance program. It is not intended to diagnose MRSA infection nor to guide or monitor treatment for MRSA infections.   Comprehensive metabolic panel     Status: Abnormal   Collection Time: 05/07/15  3:30 AM  Result Value Ref Range   Sodium 138 135 - 145 mmol/L   Potassium 3.4 (L) 3.5 - 5.1 mmol/L   Chloride 109 101 - 111 mmol/L   CO2 20 (L) 22 - 32 mmol/L   Glucose, Bld 86 65 - 99 mg/dL   BUN 6 6 - 20 mg/dL   Creatinine, Ser 0.90 0.44 - 1.00 mg/dL   Calcium 7.9 (L) 8.9 - 10.3 mg/dL   Total Protein 6.1 (L) 6.5 - 8.1 g/dL   Albumin 2.1 (L) 3.5 - 5.0 g/dL   AST 43 (H) 15 - 41 U/L   ALT 59 (H) 14 - 54 U/L   Alkaline Phosphatase 96 38 - 126 U/L   Total Bilirubin 5.1 (H) 0.3 - 1.2 mg/dL   GFR calc non Af Amer >60 >60 mL/min   GFR calc Af Amer >60 >60 mL/min    Comment: (NOTE) The eGFR has been calculated using the CKD EPI equation. This calculation has not been validated in all clinical situations. eGFR's persistently <60 mL/min signify possible Chronic Kidney Disease.    Anion gap 9 5 - 15  CBC     Status: Abnormal   Collection Time: 05/07/15  3:30 AM  Result Value Ref Range   WBC 13.9 (H) 4.0 - 10.5 K/uL   RBC 2.86 (L) 3.87  - 5.11 MIL/uL   Hemoglobin 9.9 (L) 12.0 - 15.0 g/dL   HCT 28.1 (L) 36.0 - 46.0 %   MCV 98.3 78.0 - 100.0 fL   MCH 34.6 (H) 26.0 - 34.0 pg   MCHC 35.2 30.0 - 36.0 g/dL   RDW 11.8 11.5 - 15.5 %   Platelets 191 150 - 400 K/uL    Ct Abdomen Pelvis W Contrast  05/06/2015  ADDENDUM REPORT: 05/06/2015 11:15 ADDENDUM: The impression should read: Changes consistent with mild pancreatitis. Correlation with laboratory values is recommended. Tiny subpleural nodules as described. Electronically Signed   By: Inez Catalina M.D.   On: 05/06/2015 11:15  05/06/2015  CLINICAL DATA:  Abdominal pain with nausea and vomiting EXAM: CT ABDOMEN AND PELVIS WITH CONTRAST TECHNIQUE: Multidetector CT imaging of the abdomen and pelvis was performed using the standard protocol following bolus administration of intravenous contrast. CONTRAST:  133m OMNIPAQUE IOHEXOL 300 MG/ML  SOLN COMPARISON:  None. FINDINGS: Lung bases are free of acute infiltrate or sizable effusion. Tiny subpleural nodules are identified bilaterally. The liver, spleen, adrenal glands, gallbladder and kidneys are within normal limits. Pancreas is well visualized and demonstrates some peripancreatic inflammatory changes predominately within the head and uncinate process region. The appendix is within normal limits. The bladder is well distended. No acute bony abnormality is noted. IMPRESSION: No acute intra-abdominal abnormality is noted. Tiny subpleural nodules bilaterally. These are likely postinflammatory in nature. If the patient is at high risk for bronchogenic carcinoma, follow-up chest CT at 1 year is recommended. If the patient is at low risk, no follow-up is needed. This recommendation follows the consensus statement: Guidelines for Management of Small Pulmonary Nodules Detected on CT Scans: A Statement from the [employer] published in Radiology 2005; 237:395-400. Electronically Signed: By: MInez CatalinaM.D. On: 05/06/2015 08:24   Dg Chest  Port 1 View  05/07/2015  CLINICAL DATA:  Tachypneic.  Congestion.  Fever. EXAM: PORTABLE CHEST 1 VIEW COMPARISON:  05/06/2015. FINDINGS: Heart size stable. Low lung volumes. Bilateral mild interstitial prominence suggesting mild interstitial pneumonitis. Low lung volumes with basilar atelectasis. No pleural effusion or pneumothorax IMPRESSION: 1. Bilateral mild interstitial prominence suggesting interstitial pneumonitis. 2. Low lung volumes with bibasilar atelectasis. Electronically Signed   By: TMarcello Moores Register   On: 05/07/2015 11:43   Dg Abd Acute W/chest  05/06/2015  CLINICAL DATA:  Centralized abdominal pain, nausea, diarrhea, and vomiting for 2 days. EXAM: DG ABDOMEN ACUTE W/ 1V CHEST COMPARISON:  None. FINDINGS: Shallow inspiration with atelectasis in the lung bases. Normal heart size and pulmonary vascularity. No focal airspace disease or consolidation in the lungs. No blunting of costophrenic angles. No pneumothorax. Mediastinal contours appear intact. Scattered gas and stool in the colon. No small or large bowel distention. No free intra-abdominal air. No abnormal air-fluid levels. No radiopaque stones. Visualized bones appear intact. IMPRESSION: Negative abdominal radiographs.  No acute cardiopulmonary disease. Electronically Signed   By: WLucienne CapersM.D.   On: 05/06/2015 03:55   [place]domen Limited Ruq  05/06/2015  CLINICAL DATA:  Right upper quadrant pain, nausea, vomiting, and diarrhea for 3 days. Elevated liver function tests. EXAM: [place]DOM[place] LIMITED - RIGHT UPPER QUADRANT COMPARISON:  None. FINDINGS: Gallbladder: No gallstones or wall thickening visualized. No sonographic Murphy sign noted. Common bile duct: Diameter: 7.2 mm, upper limits of normal. Liver: Visualization is limited due to patient movement and rib shadowing. Suggestion of heterogeneous increased parenchymal echotexture probably due to fatty infiltration. Possible intrahepatic bile duct dilatation. Given history of  elevated liver function studies, CT examination of the liver is suggested. IMPRESSION: Heterogeneous parenchymal echotexture changes in the liver with suggestion of intrahepatic bile duct dilatation. Extrahepatic bile ducts are upper limits of normal. Gallbladder is normal. Consider CT for further evaluation. Electronically Signed   By: WLucienne CapersM.D.   On: 05/06/2015 04:08    ROS negative except above Blood pressure 131/92,  pulse 137, temperature 99.3 F (37.4 C), temperature source Oral, resp. rate 29, height 5' 3" (1.6 m), weight 117.4 kg (258 lb 13.1 oz), last menstrual period 04/06/2015, SpO2 91 %. Physical Exam vital signs stable afebrile no acute distress lungs are clear regular rate and rhythm abdomen is soft nontender cannot reduplicate pain labs and CT and ultrasound reviewed  Assessment/Plan: Pancreatitis questionable from CBD stone Plan: We'll proceed with an MRCP with further workup and plans pending those findings including consideration of ERCP probably on Wednesday if needed otherwise recheck labs tomorrow and will check on then  [facility] E 05/07/2015, 12:38 PM

## 2015-05-08 DIAGNOSIS — N3001 Acute cystitis with hematuria: Secondary | ICD-10-CM

## 2015-05-08 LAB — HEPATITIS B CORE ANTIBODY, TOTAL: HEP B C TOTAL AB: NEGATIVE

## 2015-05-08 LAB — CBC WITH DIFFERENTIAL/PLATELET
BASOS ABS: 0 10*3/uL (ref 0.0–0.1)
Basophils Relative: 0 %
EOS ABS: 0.4 10*3/uL (ref 0.0–0.7)
Eosinophils Relative: 3 %
HCT: 25.7 % — ABNORMAL LOW (ref 36.0–46.0)
HEMOGLOBIN: 8.9 g/dL — AB (ref 12.0–15.0)
LYMPHS PCT: 17 %
Lymphs Abs: 2.1 10*3/uL (ref 0.7–4.0)
MCH: 33.8 pg (ref 26.0–34.0)
MCHC: 34.6 g/dL (ref 30.0–36.0)
MCV: 97.7 fL (ref 78.0–100.0)
MONOS PCT: 11 %
Monocytes Absolute: 1.4 10*3/uL — ABNORMAL HIGH (ref 0.1–1.0)
NEUTROS PCT: 69 %
Neutro Abs: 8.6 10*3/uL — ABNORMAL HIGH (ref 1.7–7.7)
Platelets: 209 10*3/uL (ref 150–400)
RBC: 2.63 MIL/uL — AB (ref 3.87–5.11)
RDW: 12 % (ref 11.5–15.5)
WBC: 12.5 10*3/uL — AB (ref 4.0–10.5)

## 2015-05-08 LAB — COMPREHENSIVE METABOLIC PANEL
ALK PHOS: 112 U/L (ref 38–126)
ALT: 86 U/L — ABNORMAL HIGH (ref 14–54)
ANION GAP: 5 (ref 5–15)
AST: 89 U/L — ABNORMAL HIGH (ref 15–41)
Albumin: 1.9 g/dL — ABNORMAL LOW (ref 3.5–5.0)
BILIRUBIN TOTAL: 5.1 mg/dL — AB (ref 0.3–1.2)
BUN: 6 mg/dL (ref 6–20)
CALCIUM: 8.2 mg/dL — AB (ref 8.9–10.3)
CO2: 24 mmol/L (ref 22–32)
Chloride: 109 mmol/L (ref 101–111)
Creatinine, Ser: 0.75 mg/dL (ref 0.44–1.00)
GFR calc non Af Amer: >60 mL/min (ref >60)
Glucose, Bld: 118 mg/dL — ABNORMAL HIGH (ref 65–99)
POTASSIUM: 3.2 mmol/L — AB (ref 3.5–5.1)
Sodium: 138 mmol/L (ref 135–145)
TOTAL PROTEIN: 6.1 g/dL — AB (ref 6.5–8.1)

## 2015-05-08 LAB — HEPATITIS B SURFACE ANTIGEN: Hepatitis B Surface Ag: NEGATIVE

## 2015-05-08 LAB — HEPATITIS B SURFACE ANTIBODY, QUANTITATIVE: HEPATITIS B-POST: 108.1 m[IU]/mL

## 2015-05-08 LAB — LIPASE, BLOOD: Lipase: 55 U/L — ABNORMAL HIGH (ref 11–51)

## 2015-05-08 LAB — BILIRUBIN, DIRECT: BILIRUBIN DIRECT: 3.1 mg/dL — AB (ref 0.1–0.5)

## 2015-05-08 LAB — HEPATITIS C ANTIBODY

## 2015-05-08 LAB — HIV ANTIBODY (ROUTINE TESTING W REFLEX): HIV SCREEN 4TH GENERATION: NONREACTIVE

## 2015-05-08 MED ORDER — INFLUENZA VAC SPLIT QUAD 0.5 ML IM SUSY: 0.5000 mL | PREFILLED_SYRINGE | Freq: Once | INTRAMUSCULAR | Status: DC

## 2015-05-08 MED ORDER — SODIUM CHLORIDE 0.9 % IV BOLUS (SEPSIS)
1000.0000 mL | Freq: Once | INTRAVENOUS | Status: AC
  Administered 2015-05-08: 1000 mL via INTRAVENOUS

## 2015-05-08 MED ORDER — POTASSIUM CHLORIDE 10 MEQ/100ML IV SOLN
10.0000 meq | INTRAVENOUS | Status: AC
  Administered 2015-05-08: 10 meq via INTRAVENOUS
  Filled 2015-05-08: qty 100

## 2015-05-08 MED ORDER — POTASSIUM CHLORIDE 10 MEQ/100ML IV SOLN
10.0000 meq | INTRAVENOUS | Status: AC
  Administered 2015-05-08 (×4): 10 meq via INTRAVENOUS
  Filled 2015-05-08 (×4): qty 100

## 2015-05-08 NOTE — Progress Notes (Deleted)
Subjective: Emily Mann says her pain, nausea and vomiting are all continuing to improve. However, she does still appear fatigued and in discomfort and she is still spiking fevers, up to 103 that defervesce with tylenol. She is tachypneic and tachycardic up to 140s . Denies any chest pain, diaphoresis, palpitations, abdominal distension, changes in urinary or bowel function, or other symptoms at this time.  Objective: Vital signs in last 24 hours: Filed Vitals:   05/08/15 0607 05/08/15 0822 05/08/15 0919 05/08/15 1300  BP: 125/79 146/117  127/75  Pulse: 115 134  121  Temp:  102.9 F (39.4 C) 100.7 F (38.2 C) 98.5 F (36.9 C)  TempSrc:  Oral Oral Oral  Resp: 36 25  25  Height:      Weight:      SpO2: 94% 96%  91%   Weight change:   Intake/Output Summary (Last 24 hours) at 05/08/15 1420 Last data filed at 05/08/15 0600  Gross per 24 hour  Intake   2400 ml  Output      0 ml  Net   2400 ml     Gen: Well-appearing, alert and oriented to person, place, and time HEENT: Oropharynx clear without erythema or exudate.  Neck: No cervical LAD, no thyromegaly or nodules, no JVD noted. CV: Tachycardic rate, regular rhythm, no murmurs, rubs, or gallops Pulmonary: Normal effort, CTA bilaterally, no wheezing, rales, or rhonchi Abdominal: Soft abdomen, moderate tenderness in the epigastrum and RUQ, no rebound, guarding or masses, Murphy's negative. Extremities: Distal pulses 2+ in upper and lower extremities bilaterally, no tenderness, erythema or edema Skin: No atypical appearing moles. No rashes  Lab Results: Basic Metabolic Panel:  Recent Labs Lab 05/07/15 0330 05/08/15 0340  NA 138 138  K 3.4* 3.2*  CL 109 109  CO2 20* 24  GLUCOSE 86 118*  BUN 6 6  CREATININE 0.90 0.75  CALCIUM 7.9* 8.2*   Liver Function Tests:  Recent Labs Lab 05/07/15 0330 05/08/15 0340  AST 43* 89*  ALT 59* 86*  ALKPHOS 96 112  BILITOT 5.1* 5.1*  PROT 6.1* 6.1*  ALBUMIN 2.1* 1.9*    Recent  Labs Lab 05/06/15 0055 05/08/15 0340  LIPASE 886* 55*   CBC:  Recent Labs Lab 05/06/15 0055 05/07/15 0330 05/08/15 0340  WBC 13.8* 13.9* 12.5*  NEUTROABS 11.2*  --  8.6*  HGB 12.3 9.9* 8.9*  HCT 34.0* 28.1* 25.7*  MCV 94.7 98.3 97.7  PLT 255 191 209   Fasting Lipid Panel:  Recent Labs Lab 05/06/15 1006  CHOL 149  HDL 30*  LDLCALC 104*  TRIG 76  CHOLHDL 5.0   Urinalysis:  Recent Labs Lab 05/06/15 0056  COLORURINE RED*  LABSPEC >1.046*  PHURINE 5.5  GLUCOSEU NEGATIVE  HGBUR LARGE*  BILIRUBINUR LARGE*  KETONESUR 40*  PROTEINUR >300*  NITRITE POSITIVE*  LEUKOCYTESUR MODERATE*   Micro Results: Recent Results (from the past 240 hour(s))  Urine culture     Status: None (Preliminary result)   Collection Time: 05/06/15 12:56 AM  Result Value Ref Range Status   Specimen Description URINE, RANDOM  Final   Special Requests NONE  Final   Culture >=100,000 COLONIES/mL PROTEUS MIRABILIS  Final   Report Status PENDING  Incomplete   Organism ID, Bacteria PROTEUS MIRABILIS  Final      Susceptibility   Proteus mirabilis - MIC*    AMPICILLIN <=2 SENSITIVE Sensitive     CEFAZOLIN <=4 SENSITIVE Sensitive     CEFTRIAXONE <=1 SENSITIVE Sensitive  CIPROFLOXACIN <=0.25 SENSITIVE Sensitive     GENTAMICIN <=1 SENSITIVE Sensitive     IMIPENEM 2 SENSITIVE Sensitive     NITROFURANTOIN 128 RESISTANT Resistant     TRIMETH/SULFA <=20 SENSITIVE Sensitive     AMPICILLIN/SULBACTAM <=2 SENSITIVE Sensitive     PIP/TAZO <=4 SENSITIVE Sensitive     * >=100,000 COLONIES/mL PROTEUS MIRABILIS  Blood culture (routine x 2)     Status: None (Preliminary result)   Collection Time: 05/06/15 12:43 PM  Result Value Ref Range Status   Specimen Description BLOOD LEFT ANTECUBITAL  Final   Special Requests BOTTLES DRAWN AEROBIC AND ANAEROBIC 5CC  Final   Culture NO GROWTH 2 DAYS  Final   Report Status PENDING  Incomplete  Blood culture (routine x 2)     Status: None (Preliminary result)    Collection Time: 05/06/15 12:52 PM  Result Value Ref Range Status   Specimen Description BLOOD LEFT HAND  Final   Special Requests BOTTLES DRAWN AEROBIC AND ANAEROBIC 5CC  Final   Culture NO GROWTH 2 DAYS  Final   Report Status PENDING  Incomplete  MRSA PCR Screening     Status: None   Collection Time: 05/06/15  8:33 PM  Result Value Ref Range Status   MRSA by PCR NEGATIVE NEGATIVE Final    Comment:        The GeneXpert MRSA Assay (FDA approved for NASAL specimens only), is one component of a comprehensive MRSA colonization surveillance program. It is not intended to diagnose MRSA infection nor to guide or monitor treatment for MRSA infections.    Studies/Results: Mr Abdomen Mrcp Wo Cm  05/08/2015  CLINICAL DATA:  19 year old female inpatient admitted with acute pancreatitis. EXAM: MRI ABDOMEN WITHOUT CONTRAST  (INCLUDING MRCP) TECHNIQUE: Multiplanar multisequence MR imaging of the abdomen was performed. Heavily T2-weighted images of the biliary and pancreatic ducts were obtained, and three-dimensional MRCP images were rendered by post processing. COMPARISON:  05/06/2015 right upper quadrant abdominal sonogram and CT abdomen/pelvis. FINDINGS: Images are significantly limited by motion artifact and by central signal loss due to large body habitus. Lower chest: Mild dependent atelectasis of both lung bases. Hepatobiliary: Normal liver size and configuration. Mild diffuse hepatic steatosis. No liver mass. Normal gallbladder with no cholelithiasis. No biliary ductal dilatation. Common bile duct diameter 5 mm. No filling defects are seen within the bile ducts to suggest choledocholithiasis. No enhancing ampullary or biliary mass. No biliary stricture. Pancreas: There is mild diffuse thickening of the pancreatic parenchyma with mild haziness of the peripancreatic fat, in keeping with mild acute pancreatitis. There are no regions of pancreatic nonenhancement to suggest pancreatic necrosis. No  peripancreatic fluid collections. No pancreatic mass or duct dilation. No evidence of pancreas divisum, although assessment for divisum is limited due to limited visualization of the pancreatic duct. Spleen: Normal size. No mass. Adrenals/Urinary Tract: Normal adrenals. No hydronephrosis. Normal kidneys with no renal mass. Stomach/Bowel: Grossly normal stomach. Visualized small and large bowel is normal caliber, with no bowel wall thickening. Vascular/Lymphatic: Normal caliber abdominal aorta. Patent portal, splenic, hepatic and renal veins. No pathologically enlarged lymph nodes in the abdomen. Other: No abdominal ascites or focal fluid collection. Musculoskeletal: No aggressive appearing focal osseous lesions. IMPRESSION: 1. Limited scan. Acute uncomplicated pancreatitis. No evidence of cholelithiasis, biliary ductal dilatation or choledocholithiasis. No evidence of a pancreatic or ampullary mass. 2. Mild diffuse hepatic steatosis. Electronically Signed   By: Delbert Phenix M.D.   On: 05/08/2015 07:59   Mr 3d Recon At Scanner  05/08/2015  CLINICAL DATA:  19 year old female inpatient admitted with acute pancreatitis. EXAM: MRI ABDOMEN WITHOUT CONTRAST  (INCLUDING MRCP) TECHNIQUE: Multiplanar multisequence MR imaging of the abdomen was performed. Heavily T2-weighted images of the biliary and pancreatic ducts were obtained, and three-dimensional MRCP images were rendered by post processing. COMPARISON:  05/06/2015 right upper quadrant abdominal sonogram and CT abdomen/pelvis. FINDINGS: Images are significantly limited by motion artifact and by central signal loss due to large body habitus. Lower chest: Mild dependent atelectasis of both lung bases. Hepatobiliary: Normal liver size and configuration. Mild diffuse hepatic steatosis. No liver mass. Normal gallbladder with no cholelithiasis. No biliary ductal dilatation. Common bile duct diameter 5 mm. No filling defects are seen within the bile ducts to suggest  choledocholithiasis. No enhancing ampullary or biliary mass. No biliary stricture. Pancreas: There is mild diffuse thickening of the pancreatic parenchyma with mild haziness of the peripancreatic fat, in keeping with mild acute pancreatitis. There are no regions of pancreatic nonenhancement to suggest pancreatic necrosis. No peripancreatic fluid collections. No pancreatic mass or duct dilation. No evidence of pancreas divisum, although assessment for divisum is limited due to limited visualization of the pancreatic duct. Spleen: Normal size. No mass. Adrenals/Urinary Tract: Normal adrenals. No hydronephrosis. Normal kidneys with no renal mass. Stomach/Bowel: Grossly normal stomach. Visualized small and large bowel is normal caliber, with no bowel wall thickening. Vascular/Lymphatic: Normal caliber abdominal aorta. Patent portal, splenic, hepatic and renal veins. No pathologically enlarged lymph nodes in the abdomen. Other: No abdominal ascites or focal fluid collection. Musculoskeletal: No aggressive appearing focal osseous lesions. IMPRESSION: 1. Limited scan. Acute uncomplicated pancreatitis. No evidence of cholelithiasis, biliary ductal dilatation or choledocholithiasis. No evidence of a pancreatic or ampullary mass. 2. Mild diffuse hepatic steatosis. Electronically Signed   By: Delbert PhenixJason A Poff M.D.   On: 05/08/2015 07:59   Dg Chest Port 1 View  05/07/2015  CLINICAL DATA:  Tachypneic.  Congestion.  Fever. EXAM: PORTABLE CHEST 1 VIEW COMPARISON:  05/06/2015. FINDINGS: Heart size stable. Low lung volumes. Bilateral mild interstitial prominence suggesting mild interstitial pneumonitis. Low lung volumes with basilar atelectasis. No pleural effusion or pneumothorax IMPRESSION: 1. Bilateral mild interstitial prominence suggesting interstitial pneumonitis. 2. Low lung volumes with bibasilar atelectasis. Electronically Signed   By: Maisie Fushomas  Register   On: 05/07/2015 11:43  Assessment/Plan: Principal Problem:    Pancreatitis, acute Active Problems:   Acute gallstone pancreatitis 1. Interstitial pancreatitis - 2 day h/o abdominal pain, N/V/D preceded by viral prodrome. Initially febrile here, otherwise hemodynamically stable. Labs show WBC 14, lipase 886 with elevated ALP, AST, ALT and bilirubin concerning for pancreatitis 2/2 biliary obstruction. Abd US shows possible intrahepatic duct dilation with CBD not markedly dilated but upper limit of normal, gallbladder shows no signs of cholelithiasis. CT abdomen shows changes consistent with interstitial pancreatitis. MRCP did not show choledocholithiasis or a CBD stone. Possible that biliary sludge is an etiology, but unclear cause as patient is not taking any medications, has no h/o rapid weight loss, no alcohol abuse, etc. CMP shows continued biliary pattern, however lipase has downtrended significantly to 55 (near 900 on admission). -GI following, appreciate recs -NPO -IV fluids - MIVF currently D5 1/2 @ 150/hr. Continue additional colloid boluses with NS PRN -Rocephin IV, day 3 -BCx NGTD -Repeat CBC, CMP.  -Zofran 4mg  q6hrs PRN for N/V, dilaudid 0.25mg  IV q3hrs PRN for pain  2. Acute complicated cystitis from Proteus infection - patient without urinary symptoms, but UA shows hematuria, many bacteria, positive nitrites. UCx positive for  Proteus mirabilis, pan-sensitive except for nitrofurantoin. -Continue ceftriaxone, day 3/7 for complicated cystitis; consider transition to oral agent to complete abx course  PPX - DVT: Lovenox  Dispo: Disposition is deferred at this time, awaiting improvement of current medical problems.  Anticipated discharge in approximately 1-3 day(s).   The patient does not have a current PCP (No primary care provider on file.) and does need an [facility] hospital follow-up appointment after discharge.  The patient does not have transportation limitations that hinder transportation to clinic appointments.   LOS: 2 days   Darrick Huntsman, MD 05/08/2015, 2:20 PM

## 2015-05-08 NOTE — Progress Notes (Signed)
[facility] PROGRESS NOTE Subjective patient clinically feels better this morning with less pain. MRCP was obtained and showed uncomplicated pancreatitis with no signs of cholelithiasis or choledocholithiasis. The patient does not have a very heavy drinking history.  Objective: Vital signs in last 24 hours: Temp:  [97.9 F (36.6 C)-102.9 F (39.4 C)] 100.7 F (38.2 C) (12/20 0919) Pulse Rate:  [110-134] 134 (12/20 0822) Resp:  [20-36] 25 (12/20 0822) BP: (120-156)/(70-117) 146/117 mmHg (12/20 0822) SpO2:  [86 %-98 %] 96 % (12/20 0822) Last BM Date: 05/07/15  Intake/Output from previous day: 12/19 0701 - 12/20 0700 In: 3830 [P.O.:480; I.V.:3300; IV Piggyback:50] Out: -  Intake/Output this shift:    PE: General-- alert female distress  Abdomen-- nondistended with mild epigastric tenderness  Lab Results:  Recent Labs  05/06/15 0055 05/07/15 0330 05/08/15 0340  WBC 13.8* 13.9* 12.5*  HGB 12.3 9.9* 8.9*  HCT 34.0* 28.1* 25.7*  PLT 255 191 209   BMET  Recent Labs  05/06/15 0055 05/07/15 0330 05/08/15 0340  NA 136 138 138  K 3.0* 3.4* 3.2*  CL 104 109 109  CO2 23 20* 24  CREATININE 0.96 0.90 0.75   LFT  Recent Labs  05/06/15 0055 05/07/15 0330 05/08/15 0340  PROT 7.6 6.1* 6.1*  AST 74* 43* 89*  ALT 92* 59* 86*  ALKPHOS 129* 96 112  BILITOT 5.8* 5.1* 5.1*   PT/INR No results for input(s): LABPROT, INR in the last 72 hours. PANCREAS  Recent Labs  05/06/15 0055 05/08/15 0340  LIPASE 886* 55*         Studies/Results: Mr Abdomen Mrcp Wo Cm  05/08/2015  CLINICAL DATA:  19 year old female inpatient admitted with acute pancreatitis. EXAM: MRI ABDOMEN WITHOUT CONTRAST  (INCLUDING MRCP) TECHNIQUE: Multiplanar multisequence MR imaging of the abdomen was performed. Heavily T2-weighted images of the biliary and pancreatic ducts were obtained, and three-dimensional MRCP images were rendered by post processing. COMPARISON:  05/06/2015 right  upper quadrant abdominal sonogram and CT abdomen/pelvis. FINDINGS: Images are significantly limited by motion artifact and by central signal loss due to large body habitus. Lower chest: Mild dependent atelectasis of both lung bases. Hepatobiliary: Normal liver size and configuration. Mild diffuse hepatic steatosis. No liver mass. Normal gallbladder with no cholelithiasis. No biliary ductal dilatation. Common bile duct diameter 5 mm. No filling defects are seen within the bile ducts to suggest choledocholithiasis. No enhancing ampullary or biliary mass. No biliary stricture. Pancreas: There is mild diffuse thickening of the pancreatic parenchyma with mild haziness of the peripancreatic fat, in keeping with mild acute pancreatitis. There are no regions of pancreatic nonenhancement to suggest pancreatic necrosis. No peripancreatic fluid collections. No pancreatic mass or duct dilation. No evidence of pancreas divisum, although assessment for divisum is limited due to limited visualization of the pancreatic duct. Spleen: Normal size. No mass. Adrenals/Urinary Tract: Normal adrenals. No hydronephrosis. Normal kidneys with no renal mass. Stomach/Bowel: Grossly normal stomach. Visualized small and large bowel is normal caliber, with no bowel wall thickening. Vascular/Lymphatic: Normal caliber abdominal aorta. Patent portal, splenic, hepatic and renal veins. No pathologically enlarged lymph nodes in the abdomen. Other: No abdominal ascites or focal fluid collection. Musculoskeletal: No aggressive appearing focal osseous lesions. IMPRESSION: 1. Limited scan. Acute uncomplicated pancreatitis. No evidence of cholelithiasis, biliary ductal dilatation or choledocholithiasis. No evidence of a pancreatic or ampullary mass. 2. Mild diffuse hepatic steatosis. Electronically Signed   By: Delbert Phenix M.D.   On: 05/08/2015 07:59   Mr 3d Recon At  Scanner  05/08/2015  CLINICAL DATA:  19 year old female inpatient admitted with acute  pancreatitis. EXAM: MRI ABDOMEN WITHOUT CONTRAST  (INCLUDING MRCP) TECHNIQUE: Multiplanar multisequence MR imaging of the abdomen was performed. Heavily T2-weighted images of the biliary and pancreatic ducts were obtained, and three-dimensional MRCP images were rendered by post processing. COMPARISON:  05/06/2015 right upper quadrant abdominal sonogram and CT abdomen/pelvis. FINDINGS: Images are significantly limited by motion artifact and by central signal loss due to large body habitus. Lower chest: Mild dependent atelectasis of both lung bases. Hepatobiliary: Normal liver size and configuration. Mild diffuse hepatic steatosis. No liver mass. Normal gallbladder with no cholelithiasis. No biliary ductal dilatation. Common bile duct diameter 5 mm. No filling defects are seen within the bile ducts to suggest choledocholithiasis. No enhancing ampullary or biliary mass. No biliary stricture. Pancreas: There is mild diffuse thickening of the pancreatic parenchyma with mild haziness of the peripancreatic fat, in keeping with mild acute pancreatitis. There are no regions of pancreatic nonenhancement to suggest pancreatic necrosis. No peripancreatic fluid collections. No pancreatic mass or duct dilation. No evidence of pancreas divisum, although assessment for divisum is limited due to limited visualization of the pancreatic duct. Spleen: Normal size. No mass. Adrenals/Urinary Tract: Normal adrenals. No hydronephrosis. Normal kidneys with no renal mass. Stomach/Bowel: Grossly normal stomach. Visualized small and large bowel is normal caliber, with no bowel wall thickening. Vascular/Lymphatic: Normal caliber abdominal aorta. Patent portal, splenic, hepatic and renal veins. No pathologically enlarged lymph nodes in the abdomen. Other: No abdominal ascites or focal fluid collection. Musculoskeletal: No aggressive appearing focal osseous lesions. IMPRESSION: 1. Limited scan. Acute uncomplicated pancreatitis. No evidence of  cholelithiasis, biliary ductal dilatation or choledocholithiasis. No evidence of a pancreatic or ampullary mass. 2. Mild diffuse hepatic steatosis. Electronically Signed   By: Delbert PhenixJason A Poff M.D.   On: 05/08/2015 07:59   Dg Chest Port 1 View  05/07/2015  CLINICAL DATA:  Tachypneic.  Congestion.  Fever. EXAM: PORTABLE CHEST 1 VIEW COMPARISON:  05/06/2015. FINDINGS: Heart size stable. Low lung volumes. Bilateral mild interstitial prominence suggesting mild interstitial pneumonitis. Low lung volumes with basilar atelectasis. No pleural effusion or pneumothorax IMPRESSION: 1. Bilateral mild interstitial prominence suggesting interstitial pneumonitis. 2. Low lung volumes with bibasilar atelectasis. Electronically Signed   By: Maisie Fushomas  Register   On: 05/07/2015 11:43    Medications: I have reviewed the patient's current medications.  Assessment/Plan: 1. Acute pancreatitis. Etiology unclear. She clearly is improving in the MRCP does not show any choledocholithiasis or CBD stones. It is possible she could pass sludge. She was not on any medications from home that should've cause this or any herbs or anything of this nature. I would continue to watch one more day and then consider discharge if better with f/u with Dr Ewing SchleinMagod. Continue fat free clears.   Lerin Jech JR,Delmus Warwick L 05/08/2015, 10:04 AM  Pager: [phone] If no answer or after hours call [phone]

## 2015-05-08 NOTE — Progress Notes (Signed)
Subjective: Emily Mann says her pain, nausea and vomiting are all continuing to improve. However, she does still appear fatigued and in discomfort and she is still spiking fevers, up to 103 that defervesce with tylenol. She is tachypneic and tachycardic up to 140s . Denies any chest pain, diaphoresis, palpitations, abdominal distension, changes in urinary or bowel function, or other symptoms at this time.  Objective: Vital signs in last 24 hours: Filed Vitals:   05/08/15 0607 05/08/15 0822 05/08/15 0919 05/08/15 1300  BP: 125/79 146/117  127/75  Pulse: 115 134  121  Temp:  102.9 F (39.4 C) 100.7 F (38.2 C) 98.5 F (36.9 C)  TempSrc:  Oral Oral Oral  Resp: 36 25  25  Height:      Weight:      SpO2: 94% 96%  91%   Weight change:   Intake/Output Summary (Last 24 hours) at 05/08/15 1431 Last data filed at 05/08/15 0600  Gross per 24 hour  Intake   2400 ml  Output      0 ml  Net   2400 ml     Gen: Well-appearing, alert and oriented to person, place, and time HEENT: Oropharynx clear without erythema or exudate.  Neck: No cervical LAD, no thyromegaly or nodules, no JVD noted. CV: Tachycardic rate, regular rhythm, no murmurs, rubs, or gallops Pulmonary: Normal effort, CTA bilaterally, no wheezing, rales, or rhonchi Abdominal: Soft abdomen, moderate tenderness in the epigastrum and RUQ, no rebound, guarding or masses, Murphy's negative. Extremities: Distal pulses 2+ in upper and lower extremities bilaterally, no tenderness, erythema or edema Skin: No atypical appearing moles. No rashes  Lab Results: Basic Metabolic Panel:  Recent Labs Lab 05/07/15 0330 05/08/15 0340  NA 138 138  K 3.4* 3.2*  CL 109 109  CO2 20* 24  GLUCOSE 86 118*  BUN 6 6  CREATININE 0.90 0.75  CALCIUM 7.9* 8.2*   Liver Function Tests:  Recent Labs Lab 05/07/15 0330 05/08/15 0340  AST 43* 89*  ALT 59* 86*  ALKPHOS 96 112  BILITOT 5.1* 5.1*  PROT 6.1* 6.1*  ALBUMIN 2.1* 1.9*    Recent  Labs Lab 05/06/15 0055 05/08/15 0340  LIPASE 886* 55*   CBC:  Recent Labs Lab 05/06/15 0055 05/07/15 0330 05/08/15 0340  WBC 13.8* 13.9* 12.5*  NEUTROABS 11.2*  --  8.6*  HGB 12.3 9.9* 8.9*  HCT 34.0* 28.1* 25.7*  MCV 94.7 98.3 97.7  PLT 255 191 209   Fasting Lipid Panel:  Recent Labs Lab 05/06/15 1006  CHOL 149  HDL 30*  LDLCALC 104*  TRIG 76  CHOLHDL 5.0   Urinalysis:  Recent Labs Lab 05/06/15 0056  COLORURINE RED*  LABSPEC >1.046*  PHURINE 5.5  GLUCOSEU NEGATIVE  HGBUR LARGE*  BILIRUBINUR LARGE*  KETONESUR 40*  PROTEINUR >300*  NITRITE POSITIVE*  LEUKOCYTESUR MODERATE*   Micro Results: Recent Results (from the past 240 hour(s))  Urine culture     Status: None (Preliminary result)   Collection Time: 05/06/15 12:56 AM  Result Value Ref Range Status   Specimen Description URINE, RANDOM  Final   Special Requests NONE  Final   Culture >=100,000 COLONIES/mL PROTEUS MIRABILIS  Final   Report Status PENDING  Incomplete   Organism ID, Bacteria PROTEUS MIRABILIS  Final      Susceptibility   Proteus mirabilis - MIC*    AMPICILLIN <=2 SENSITIVE Sensitive     CEFAZOLIN <=4 SENSITIVE Sensitive     CEFTRIAXONE <=1 SENSITIVE Sensitive  CIPROFLOXACIN <=0.25 SENSITIVE Sensitive     GENTAMICIN <=1 SENSITIVE Sensitive     IMIPENEM 2 SENSITIVE Sensitive     NITROFURANTOIN 128 RESISTANT Resistant     TRIMETH/SULFA <=20 SENSITIVE Sensitive     AMPICILLIN/SULBACTAM <=2 SENSITIVE Sensitive     PIP/TAZO <=4 SENSITIVE Sensitive     * >=100,000 COLONIES/mL PROTEUS MIRABILIS  Blood culture (routine x 2)     Status: None (Preliminary result)   Collection Time: 05/06/15 12:43 PM  Result Value Ref Range Status   Specimen Description BLOOD LEFT ANTECUBITAL  Final   Special Requests BOTTLES DRAWN AEROBIC AND ANAEROBIC 5CC  Final   Culture NO GROWTH 2 DAYS  Final   Report Status PENDING  Incomplete  Blood culture (routine x 2)     Status: None (Preliminary result)    Collection Time: 05/06/15 12:52 PM  Result Value Ref Range Status   Specimen Description BLOOD LEFT HAND  Final   Special Requests BOTTLES DRAWN AEROBIC AND ANAEROBIC 5CC  Final   Culture NO GROWTH 2 DAYS  Final   Report Status PENDING  Incomplete  MRSA PCR Screening     Status: None   Collection Time: 05/06/15  8:33 PM  Result Value Ref Range Status   MRSA by PCR NEGATIVE NEGATIVE Final    Comment:        The GeneXpert MRSA Assay (FDA approved for NASAL specimens only), is one component of a comprehensive MRSA colonization surveillance program. It is not intended to diagnose MRSA infection nor to guide or monitor treatment for MRSA infections.    Studies/Results: Mr Abdomen Mrcp Wo Cm  05/08/2015  CLINICAL DATA:  19 year old female inpatient admitted with acute pancreatitis. EXAM: MRI ABDOMEN WITHOUT CONTRAST  (INCLUDING MRCP) TECHNIQUE: Multiplanar multisequence MR imaging of the abdomen was performed. Heavily T2-weighted images of the biliary and pancreatic ducts were obtained, and three-dimensional MRCP images were rendered by post processing. COMPARISON:  05/06/2015 right upper quadrant abdominal sonogram and CT abdomen/pelvis. FINDINGS: Images are significantly limited by motion artifact and by central signal loss due to large body habitus. Lower chest: Mild dependent atelectasis of both lung bases. Hepatobiliary: Normal liver size and configuration. Mild diffuse hepatic steatosis. No liver mass. Normal gallbladder with no cholelithiasis. No biliary ductal dilatation. Common bile duct diameter 5 mm. No filling defects are seen within the bile ducts to suggest choledocholithiasis. No enhancing ampullary or biliary mass. No biliary stricture. Pancreas: There is mild diffuse thickening of the pancreatic parenchyma with mild haziness of the peripancreatic fat, in keeping with mild acute pancreatitis. There are no regions of pancreatic nonenhancement to suggest pancreatic necrosis. No  peripancreatic fluid collections. No pancreatic mass or duct dilation. No evidence of pancreas divisum, although assessment for divisum is limited due to limited visualization of the pancreatic duct. Spleen: Normal size. No mass. Adrenals/Urinary Tract: Normal adrenals. No hydronephrosis. Normal kidneys with no renal mass. Stomach/Bowel: Grossly normal stomach. Visualized small and large bowel is normal caliber, with no bowel wall thickening. Vascular/Lymphatic: Normal caliber abdominal aorta. Patent portal, splenic, hepatic and renal veins. No pathologically enlarged lymph nodes in the abdomen. Other: No abdominal ascites or focal fluid collection. Musculoskeletal: No aggressive appearing focal osseous lesions. IMPRESSION: 1. Limited scan. Acute uncomplicated pancreatitis. No evidence of cholelithiasis, biliary ductal dilatation or choledocholithiasis. No evidence of a pancreatic or ampullary mass. 2. Mild diffuse hepatic steatosis. Electronically Signed   By: Delbert Phenix M.D.   On: 05/08/2015 07:59   Mr 3d Recon At Scanner  05/08/2015  CLINICAL DATA:  19 year old female inpatient admitted with acute pancreatitis. EXAM: MRI ABDOMEN WITHOUT CONTRAST  (INCLUDING MRCP) TECHNIQUE: Multiplanar multisequence MR imaging of the abdomen was performed. Heavily T2-weighted images of the biliary and pancreatic ducts were obtained, and three-dimensional MRCP images were rendered by post processing. COMPARISON:  05/06/2015 right upper quadrant abdominal sonogram and CT abdomen/pelvis. FINDINGS: Images are significantly limited by motion artifact and by central signal loss due to large body habitus. Lower chest: Mild dependent atelectasis of both lung bases. Hepatobiliary: Normal liver size and configuration. Mild diffuse hepatic steatosis. No liver mass. Normal gallbladder with no cholelithiasis. No biliary ductal dilatation. Common bile duct diameter 5 mm. No filling defects are seen within the bile ducts to suggest  choledocholithiasis. No enhancing ampullary or biliary mass. No biliary stricture. Pancreas: There is mild diffuse thickening of the pancreatic parenchyma with mild haziness of the peripancreatic fat, in keeping with mild acute pancreatitis. There are no regions of pancreatic nonenhancement to suggest pancreatic necrosis. No peripancreatic fluid collections. No pancreatic mass or duct dilation. No evidence of pancreas divisum, although assessment for divisum is limited due to limited visualization of the pancreatic duct. Spleen: Normal size. No mass. Adrenals/Urinary Tract: Normal adrenals. No hydronephrosis. Normal kidneys with no renal mass. Stomach/Bowel: Grossly normal stomach. Visualized small and large bowel is normal caliber, with no bowel wall thickening. Vascular/Lymphatic: Normal caliber abdominal aorta. Patent portal, splenic, hepatic and renal veins. No pathologically enlarged lymph nodes in the abdomen. Other: No abdominal ascites or focal fluid collection. Musculoskeletal: No aggressive appearing focal osseous lesions. IMPRESSION: 1. Limited scan. Acute uncomplicated pancreatitis. No evidence of cholelithiasis, biliary ductal dilatation or choledocholithiasis. No evidence of a pancreatic or ampullary mass. 2. Mild diffuse hepatic steatosis. Electronically Signed   By: Delbert PhenixJason A Poff M.D.   On: 05/08/2015 07:59   Dg Chest Port 1 View  05/07/2015  CLINICAL DATA:  Tachypneic.  Congestion.  Fever. EXAM: PORTABLE CHEST 1 VIEW COMPARISON:  05/06/2015. FINDINGS: Heart size stable. Low lung volumes. Bilateral mild interstitial prominence suggesting mild interstitial pneumonitis. Low lung volumes with basilar atelectasis. No pleural effusion or pneumothorax IMPRESSION: 1. Bilateral mild interstitial prominence suggesting interstitial pneumonitis. 2. Low lung volumes with bibasilar atelectasis. Electronically Signed   By: Maisie Fushomas  Register   On: 05/07/2015 11:43  Assessment/Plan: Principal Problem:    Pancreatitis, acute Active Problems:   Acute gallstone pancreatitis 1. Interstitial pancreatitis - 2 day h/o abdominal pain, N/V/D preceded by viral prodrome. Initially febrile here, otherwise hemodynamically stable. Labs show WBC 14, lipase 886 with elevated ALP, AST, ALT and bilirubin concerning for pancreatitis 2/2 biliary obstruction. Abd US shows possible intrahepatic duct dilation with CBD not markedly dilated but upper limit of normal, gallbladder shows no signs of cholelithiasis. CT abdomen shows changes consistent with interstitial pancreatitis. MRCP did not show choledocholithiasis or a CBD stone. Possible that biliary sludge is an etiology, but unclear cause as patient is not taking any medications, has no h/o rapid weight loss, no alcohol abuse, etc. CMP shows continued biliary pattern, however lipase has downtrended significantly to 55 (near 900 on admission). -GI following, appreciate recs -NPO -IV fluids - MIVF currently D5 1/2 @ 150/hr. Continue additional colloid boluses with NS PRN -Rocephin IV, day 3 -BCx NGTD -Repeat CBC, CMP.  -Zofran 4mg  q6hrs PRN for N/V, dilaudid 0.25mg  IV q3hrs PRN for pain  2. Acute complicated cystitis from Proteus infection - patient without urinary symptoms, but UA shows hematuria, many bacteria, positive nitrites. UCx positive for  Proteus mirabilis, pan-sensitive except for nitrofurantoin. -Continue ceftriaxone, day 3/7 for complicated cystitis; consider transition to oral agent to complete abx course  PPX - DVT: Lovenox  Dispo: Disposition is deferred at this time, awaiting improvement of current medical problems.  Anticipated discharge in approximately 1-3 day(s).   The patient does not have a current PCP (No primary care provider on file.) and does need an [facility] hospital follow-up appointment after discharge.  The patient does not have transportation limitations that hinder transportation to clinic appointments.   LOS: 2 days   Darrick Huntsman, MD 05/08/2015, 2:31 PM

## 2015-05-09 DIAGNOSIS — N3001 Acute cystitis with hematuria: Secondary | ICD-10-CM | POA: Insufficient documentation

## 2015-05-09 LAB — COMPREHENSIVE METABOLIC PANEL
ALK PHOS: 105 U/L (ref 38–126)
ALT: 96 U/L — AB (ref 14–54)
AST: 70 U/L — ABNORMAL HIGH (ref 15–41)
Albumin: 1.8 g/dL — ABNORMAL LOW (ref 3.5–5.0)
Anion gap: 7 (ref 5–15)
BUN: <5 mg/dL — ABNORMAL LOW (ref 6–20)
CALCIUM: 8.1 mg/dL — AB (ref 8.9–10.3)
CO2: 23 mmol/L (ref 22–32)
CREATININE: 0.73 mg/dL (ref 0.44–1.00)
Chloride: 107 mmol/L (ref 101–111)
Glucose, Bld: 170 mg/dL — ABNORMAL HIGH (ref 65–99)
Potassium: 3.3 mmol/L — ABNORMAL LOW (ref 3.5–5.1)
Sodium: 137 mmol/L (ref 135–145)
Total Bilirubin: 2.9 mg/dL — ABNORMAL HIGH (ref 0.3–1.2)
Total Protein: 6.1 g/dL — ABNORMAL LOW (ref 6.5–8.1)

## 2015-05-09 LAB — URINE CULTURE

## 2015-05-09 LAB — CBC
HCT: 24.9 % — ABNORMAL LOW (ref 36.0–46.0)
Hemoglobin: 8.6 g/dL — ABNORMAL LOW (ref 12.0–15.0)
MCH: 33.6 pg (ref 26.0–34.0)
MCHC: 34.5 g/dL (ref 30.0–36.0)
MCV: 97.3 fL (ref 78.0–100.0)
PLATELETS: 223 10*3/uL (ref 150–400)
RBC: 2.56 MIL/uL — AB (ref 3.87–5.11)
RDW: 12 % (ref 11.5–15.5)
WBC: 14.7 10*3/uL — AB (ref 4.0–10.5)

## 2015-05-09 LAB — MAGNESIUM: Magnesium: 1.6 mg/dL — ABNORMAL LOW (ref 1.7–2.4)

## 2015-05-09 MED ORDER — IBUPROFEN 200 MG PO TABS
600.0000 mg | ORAL_TABLET | Freq: Four times a day (QID) | ORAL | Status: DC | PRN
  Administered 2015-05-09: 600 mg via ORAL
  Filled 2015-05-09: qty 3

## 2015-05-09 MED ORDER — ONDANSETRON HCL 4 MG PO TABS: 4.0000 mg | ORAL_TABLET | Freq: Three times a day (TID) | ORAL | Status: DC | PRN

## 2015-05-09 MED ORDER — ACETAMINOPHEN 500 MG PO TABS: 500.0000 mg | ORAL_TABLET | Freq: Four times a day (QID) | ORAL | Status: AC | PRN

## 2015-05-09 MED ORDER — OXYCODONE-ACETAMINOPHEN 5-325 MG PO TABS
1.0000 | ORAL_TABLET | ORAL | Status: DC | PRN
  Filled 2015-05-09: qty 2

## 2015-05-09 MED ORDER — CEPHALEXIN 500 MG PO CAPS
500.0000 mg | ORAL_CAPSULE | Freq: Two times a day (BID) | ORAL | Status: DC
  Administered 2015-05-09 – 2015-05-10 (×3): 500 mg via ORAL
  Filled 2015-05-09 (×5): qty 1

## 2015-05-09 MED ORDER — OXYCODONE-ACETAMINOPHEN 5-325 MG PO TABS: 1.0000 | ORAL_TABLET | Freq: Four times a day (QID) | ORAL | Status: DC | PRN

## 2015-05-09 MED ORDER — CEPHALEXIN 500 MG PO CAPS: 500.0000 mg | ORAL_CAPSULE | Freq: Two times a day (BID) | ORAL | Status: DC

## 2015-05-09 MED ORDER — OXYCODONE-ACETAMINOPHEN 5-325 MG PO TABS
1.0000 | ORAL_TABLET | Freq: Once | ORAL | Status: AC
  Administered 2015-05-09: 1 via ORAL
  Filled 2015-05-09: qty 1

## 2015-05-09 MED ORDER — OXYCODONE-ACETAMINOPHEN 5-325 MG PO TABS
1.0000 | ORAL_TABLET | ORAL | Status: DC | PRN
  Administered 2015-05-09: 2 via ORAL

## 2015-05-09 MED ORDER — POTASSIUM CHLORIDE CRYS ER 20 MEQ PO TBCR
40.0000 meq | EXTENDED_RELEASE_TABLET | Freq: Once | ORAL | Status: AC
  Administered 2015-05-09: 40 meq via ORAL
  Filled 2015-05-09: qty 2

## 2015-05-09 NOTE — Progress Notes (Signed)
Pt's IV leaking this am IV team came to restart pt does not want to be stuck again - tolerating po fluids- Dr Ewing SchleinMagod here asked RN to notify her Admitting DR. York SpanielSaid it was OK to leave IV out orders received from Dr Isabella BowensKrall.

## 2015-05-09 NOTE — Progress Notes (Signed)
Rodman PickleBreana Galea 11:33 AM  Subjective: Patient doing fine denying pain eating some and unfortunately the IV team is having trouble finding a IV site Objective: Vital signs stable afebrile no acute distress abdomen is soft nontender rare bowel sound liver tests decreased fractionation yesterday both direct and indirect  Assessment: Pancreatitis improved  Plan: Hopefully patient can be managed without an IV and hopefully go home soon and we will await autoimmune marker and consider a EUS as an outpatient and I'm happy to see back in a few weeks and consider adding pancreatic enzymes if trouble with by mouth and please call us if we can be of any further assistance with this hospital stay  [facility]ownMAGOD,Kadian Barcellos E  Pager [phone] After 5PM or if no answer call [phone]

## 2015-05-09 NOTE — Progress Notes (Signed)
Pt had small clear emesis approx 50 cc. Medicated with Zofran 4 mg PO at 1715 02 sats dropped to high 70's HR up to 128 BP BP 150/94 [facility] C[facility] Dis[facility] for this evening. Elink notified of cancelled Discharge pt denies any Chest pain or pain with deep inhalation. 02 placed at 4 L n/c When pt was in chair earlier noted to have sleep apnea

## 2015-05-09 NOTE — Progress Notes (Signed)
Subjective: Mrs. Tollison says her pain, nausea and vomiting are all much better. She is still fatigued, resting this morning but denies other symptoms. She is much less tachypneic and tachycardic up to 120s . Denies any chest pain, diaphoresis, palpitations, abdominal distension, changes in urinary or bowel function, or other symptoms at this time.  Objective: Vital signs in last 24 hours: Filed Vitals:   05/08/15 2057 05/09/15 0017 05/09/15 0100 05/09/15 0437  BP: 144/91 132/90 139/93 137/76  Pulse: 130 112 110 115  Temp: 98.4 F (36.9 C) 98.2 F (36.8 C)  98.6 F (37 C)  TempSrc: Oral Oral  Oral  Resp: 27 35 23   Height:      Weight:      SpO2: 95% 94% 95%    Weight change:   Intake/Output Summary (Last 24 hours) at 05/09/15 0827 Last data filed at 05/08/15 2100  Gross per 24 hour  Intake   3380 ml  Output      0 ml  Net   3380 ml     Gen: Well-appearing, alert and oriented to person, place, and time HEENT: Oropharynx clear without erythema or exudate.  Neck: No cervical LAD, no thyromegaly or nodules, no JVD noted. CV: Tachycardic rate, regular rhythm, no murmurs, rubs, or gallops Pulmonary: Normal effort, CTA bilaterally, no wheezing, rales, or rhonchi Abdominal: Soft abdomen, moderate tenderness in the epigastrum and RUQ, no rebound, guarding or masses, Murphy's negative. Extremities: Distal pulses 2+ in upper and lower extremities bilaterally, no tenderness, erythema or edema Skin: No atypical appearing moles. No rashes  Lab Results: Basic Metabolic Panel:  Recent Labs Lab 05/08/15 0340 05/09/15 0453  NA 138 137  K 3.2* 3.3*  CL 109 107  CO2 24 23  GLUCOSE 118* 170*  BUN 6 <5*  CREATININE 0.75 0.73  CALCIUM 8.2* 8.1*   Liver Function Tests:  Recent Labs Lab 05/08/15 0340 05/09/15 0453  AST 89* 70*  ALT 86* 96*  ALKPHOS 112 105  BILITOT 5.1* 2.9*  PROT 6.1* 6.1*  ALBUMIN 1.9* 1.8*    Recent Labs Lab 05/06/15 0055 05/08/15 0340  LIPASE  886* 55*   CBC:  Recent Labs Lab 05/06/15 0055  05/08/15 0340 05/09/15 0453  WBC 13.8*  < > 12.5* 14.7*  NEUTROABS 11.2*  --  8.6*  --   HGB 12.3  < > 8.9* 8.6*  HCT 34.0*  < > 25.7* 24.9*  MCV 94.7  < > 97.7 97.3  PLT 255  < > 209 223  < > = values in this interval not displayed. Fasting Lipid Panel:  Recent Labs Lab 05/06/15 1006  CHOL 149  HDL 30*  LDLCALC 104*  TRIG 76  CHOLHDL 5.0   Urinalysis:  Recent Labs Lab 05/06/15 0056  COLORURINE RED*  LABSPEC >1.046*  PHURINE 5.5  GLUCOSEU NEGATIVE  HGBUR LARGE*  BILIRUBINUR LARGE*  KETONESUR 40*  PROTEINUR >300*  NITRITE POSITIVE*  LEUKOCYTESUR MODERATE*   Micro Results: Recent Results (from the past 240 hour(s))  Urine culture     Status: None (Preliminary result)   Collection Time: 05/06/15 12:56 AM  Result Value Ref Range Status   Specimen Description URINE, RANDOM  Final   Special Requests NONE  Final   Culture >=100,000 COLONIES/mL PROTEUS MIRABILIS  Final   Report Status PENDING  Incomplete   Organism ID, Bacteria PROTEUS MIRABILIS  Final      Susceptibility   Proteus mirabilis - MIC*    AMPICILLIN <=2 SENSITIVE Sensitive  CEFAZOLIN <=4 SENSITIVE Sensitive     CEFTRIAXONE <=1 SENSITIVE Sensitive     CIPROFLOXACIN <=0.25 SENSITIVE Sensitive     GENTAMICIN <=1 SENSITIVE Sensitive     IMIPENEM 2 SENSITIVE Sensitive     NITROFURANTOIN 128 RESISTANT Resistant     TRIMETH/SULFA <=20 SENSITIVE Sensitive     AMPICILLIN/SULBACTAM <=2 SENSITIVE Sensitive     PIP/TAZO <=4 SENSITIVE Sensitive     * >=100,000 COLONIES/mL PROTEUS MIRABILIS  Blood culture (routine x 2)     Status: None (Preliminary result)   Collection Time: 05/06/15 12:43 PM  Result Value Ref Range Status   Specimen Description BLOOD LEFT ANTECUBITAL  Final   Special Requests BOTTLES DRAWN AEROBIC AND ANAEROBIC 5CC  Final   Culture NO GROWTH 2 DAYS  Final   Report Status PENDING  Incomplete  Blood culture (routine x 2)     Status: None  (Preliminary result)   Collection Time: 05/06/15 12:52 PM  Result Value Ref Range Status   Specimen Description BLOOD LEFT HAND  Final   Special Requests BOTTLES DRAWN AEROBIC AND ANAEROBIC 5CC  Final   Culture NO GROWTH 2 DAYS  Final   Report Status PENDING  Incomplete  MRSA PCR Screening     Status: None   Collection Time: 05/06/15  8:33 PM  Result Value Ref Range Status   MRSA by PCR NEGATIVE NEGATIVE Final    Comment:        The GeneXpert MRSA Assay (FDA approved for NASAL specimens only), is one component of a comprehensive MRSA colonization surveillance program. It is not intended to diagnose MRSA infection nor to guide or monitor treatment for MRSA infections.    Studies/Results: Mr Abdomen Mrcp Wo Cm  05/08/2015  CLINICAL DATA:  19 year old female inpatient admitted with acute pancreatitis. EXAM: MRI ABDOMEN WITHOUT CONTRAST  (INCLUDING MRCP) TECHNIQUE: Multiplanar multisequence MR imaging of the abdomen was performed. Heavily T2-weighted images of the biliary and pancreatic ducts were obtained, and three-dimensional MRCP images were rendered by post processing. COMPARISON:  05/06/2015 right upper quadrant abdominal sonogram and CT abdomen/pelvis. FINDINGS: Images are significantly limited by motion artifact and by central signal loss due to large body habitus. Lower chest: Mild dependent atelectasis of both lung bases. Hepatobiliary: Normal liver size and configuration. Mild diffuse hepatic steatosis. No liver mass. Normal gallbladder with no cholelithiasis. No biliary ductal dilatation. Common bile duct diameter 5 mm. No filling defects are seen within the bile ducts to suggest choledocholithiasis. No enhancing ampullary or biliary mass. No biliary stricture. Pancreas: There is mild diffuse thickening of the pancreatic parenchyma with mild haziness of the peripancreatic fat, in keeping with mild acute pancreatitis. There are no regions of pancreatic nonenhancement to suggest  pancreatic necrosis. No peripancreatic fluid collections. No pancreatic mass or duct dilation. No evidence of pancreas divisum, although assessment for divisum is limited due to limited visualization of the pancreatic duct. Spleen: Normal size. No mass. Adrenals/Urinary Tract: Normal adrenals. No hydronephrosis. Normal kidneys with no renal mass. Stomach/Bowel: Grossly normal stomach. Visualized small and large bowel is normal caliber, with no bowel wall thickening. Vascular/Lymphatic: Normal caliber abdominal aorta. Patent portal, splenic, hepatic and renal veins. No pathologically enlarged lymph nodes in the abdomen. Other: No abdominal ascites or focal fluid collection. Musculoskeletal: No aggressive appearing focal osseous lesions. IMPRESSION: 1. Limited scan. Acute uncomplicated pancreatitis. No evidence of cholelithiasis, biliary ductal dilatation or choledocholithiasis. No evidence of a pancreatic or ampullary mass. 2. Mild diffuse hepatic steatosis. Electronically Signed   By:  Delbert PhenixJason A Poff M.D.   On: 05/08/2015 07:59   Mr 3d Recon At Scanner  05/08/2015  CLINICAL DATA:  19 year old female inpatient admitted with acute pancreatitis. EXAM: MRI ABDOMEN WITHOUT CONTRAST  (INCLUDING MRCP) TECHNIQUE: Multiplanar multisequence MR imaging of the abdomen was performed. Heavily T2-weighted images of the biliary and pancreatic ducts were obtained, and three-dimensional MRCP images were rendered by post processing. COMPARISON:  05/06/2015 right upper quadrant abdominal sonogram and CT abdomen/pelvis. FINDINGS: Images are significantly limited by motion artifact and by central signal loss due to large body habitus. Lower chest: Mild dependent atelectasis of both lung bases. Hepatobiliary: Normal liver size and configuration. Mild diffuse hepatic steatosis. No liver mass. Normal gallbladder with no cholelithiasis. No biliary ductal dilatation. Common bile duct diameter 5 mm. No filling defects are seen within the bile  ducts to suggest choledocholithiasis. No enhancing ampullary or biliary mass. No biliary stricture. Pancreas: There is mild diffuse thickening of the pancreatic parenchyma with mild haziness of the peripancreatic fat, in keeping with mild acute pancreatitis. There are no regions of pancreatic nonenhancement to suggest pancreatic necrosis. No peripancreatic fluid collections. No pancreatic mass or duct dilation. No evidence of pancreas divisum, although assessment for divisum is limited due to limited visualization of the pancreatic duct. Spleen: Normal size. No mass. Adrenals/Urinary Tract: Normal adrenals. No hydronephrosis. Normal kidneys with no renal mass. Stomach/Bowel: Grossly normal stomach. Visualized small and large bowel is normal caliber, with no bowel wall thickening. Vascular/Lymphatic: Normal caliber abdominal aorta. Patent portal, splenic, hepatic and renal veins. No pathologically enlarged lymph nodes in the abdomen. Other: No abdominal ascites or focal fluid collection. Musculoskeletal: No aggressive appearing focal osseous lesions. IMPRESSION: 1. Limited scan. Acute uncomplicated pancreatitis. No evidence of cholelithiasis, biliary ductal dilatation or choledocholithiasis. No evidence of a pancreatic or ampullary mass. 2. Mild diffuse hepatic steatosis. Electronically Signed   By: Delbert PhenixJason A Poff M.D.   On: 05/08/2015 07:59   Dg Chest Port 1 View  05/07/2015  CLINICAL DATA:  Tachypneic.  Congestion.  Fever. EXAM: PORTABLE CHEST 1 VIEW COMPARISON:  05/06/2015. FINDINGS: Heart size stable. Low lung volumes. Bilateral mild interstitial prominence suggesting mild interstitial pneumonitis. Low lung volumes with basilar atelectasis. No pleural effusion or pneumothorax IMPRESSION: 1. Bilateral mild interstitial prominence suggesting interstitial pneumonitis. 2. Low lung volumes with bibasilar atelectasis. Electronically Signed   By: Maisie Fushomas  Register   On: 05/07/2015 11:43  Assessment/Plan: Principal  Problem:   Pancreatitis, acute Active Problems:   Acute gallstone pancreatitis 1. Interstitial pancreatitis - 2 day h/o abdominal pain, N/V/D preceded by viral prodrome. Initially febrile here, otherwise hemodynamically stable. Labs show WBC 14, lipase 886 with elevated ALP, AST, ALT and bilirubin concerning for pancreatitis 2/2 biliary obstruction. Abd US shows possible intrahepatic duct dilation with CBD not markedly dilated but upper limit of normal, gallbladder shows no signs of cholelithiasis. CT abdomen shows changes consistent with interstitial pancreatitis. MRCP did not show choledocholithiasis or a CBD stone. Possible that biliary sludge is an etiology, but unclear cause as patient is not taking any medications, has no h/o rapid weight loss, no alcohol abuse, etc. CMP shows improving biliary pattern. -GI following, appreciate recs -Advance diet as tolerated -IV fluids - MIVF currently D5 1/2 @ 150/hr. Continue additional colloid boluses with NS PRN -F/u IgG4 -BCx NGTD -Repeat CBC, CMP.  -Transition to oral analgesics and antiemetics  2. Acute complicated cystitis from Proteus infection - patient without urinary symptoms, but UA shows hematuria, many bacteria, positive nitrites. UCx positive for  Proteus mirabilis, pan-sensitive except for nitrofurantoin. -s/p ceftriaxone x 3days; transitioned to oral keflex today, day 4/7  PPX - DVT: Lovenox  Dispo: Disposition is deferred at this time, awaiting improvement of current medical problems.  Anticipated discharge in approximately 1-3 day(s).   The patient does not have a current PCP (No primary care provider on file.) and does need an [facility] hospital follow-up appointment after discharge.  The patient does not have transportation limitations that hinder transportation to clinic appointments.   LOS: 3 days   Darrick Huntsman, MD 05/09/2015, 8:27 AM

## 2015-05-09 NOTE — Progress Notes (Signed)
Went over all discharge instructions. Gave pt exit care notes on pancreatitis and what to watch for. Laurette SchimkeGastro wants to see pt back in follow up 2 to 3 weeks MD called office they want her to call for her appt. Will provide pt the phone number [facility]

## 2015-05-09 NOTE — Discharge Instructions (Addendum)
Ms. Emily Mann,  Please make sure to drink lots of water and to avoid fatty foods as you continue to recover from your pancreatitis. I have sent in prescriptions to your pharmacy to help with any leftover pain and nausea.   You have a follow-up appointment in our clinic (the Internal Medicine clinic on the first floor of this hospital) on Friday, January 6th @ 2:45pm. Please make this appointment.  Finally, the GI doctors that saw you (Dr. Ewing [facility]Magod) will call you soon to make an appointment in their clinic for follow-up. It is really important that you make this appointment. Try your best to exercise and eat healthy foods!   We're happy you're feeling better!

## 2015-05-10 LAB — COMPREHENSIVE METABOLIC PANEL
ALBUMIN: 1.7 g/dL — AB (ref 3.5–5.0)
ALK PHOS: 106 U/L (ref 38–126)
ALT: 105 U/L — AB (ref 14–54)
ANION GAP: 9 (ref 5–15)
AST: 91 U/L — ABNORMAL HIGH (ref 15–41)
BUN: <5 mg/dL — ABNORMAL LOW (ref 6–20)
CALCIUM: 8.1 mg/dL — AB (ref 8.9–10.3)
CHLORIDE: 102 mmol/L (ref 101–111)
CO2: 25 mmol/L (ref 22–32)
Creatinine, Ser: 0.64 mg/dL (ref 0.44–1.00)
GFR calc Af Amer: >60 mL/min (ref >60)
GFR calc non Af Amer: >60 mL/min (ref >60)
GLUCOSE: 92 mg/dL (ref 65–99)
Potassium: 3.3 mmol/L — ABNORMAL LOW (ref 3.5–5.1)
SODIUM: 136 mmol/L (ref 135–145)
Total Bilirubin: 3.1 mg/dL — ABNORMAL HIGH (ref 0.3–1.2)
Total Protein: 6.1 g/dL — ABNORMAL LOW (ref 6.5–8.1)

## 2015-05-10 LAB — CBC
HCT: 24.7 % — ABNORMAL LOW (ref 36.0–46.0)
HEMOGLOBIN: 8.5 g/dL — AB (ref 12.0–15.0)
MCH: 33.9 pg (ref 26.0–34.0)
MCHC: 34.4 g/dL (ref 30.0–36.0)
MCV: 98.4 fL (ref 78.0–100.0)
Platelets: 222 10*3/uL (ref 150–400)
RBC: 2.51 MIL/uL — ABNORMAL LOW (ref 3.87–5.11)
RDW: 12.1 % (ref 11.5–15.5)
WBC: 16.3 10*3/uL — ABNORMAL HIGH (ref 4.0–10.5)

## 2015-05-10 LAB — IGG 4: IGG 4: 63 mg/dL (ref 1–291)

## 2015-05-10 LAB — MAGNESIUM: MAGNESIUM: 1.7 mg/dL (ref 1.7–2.4)

## 2015-05-10 MED ORDER — POTASSIUM CHLORIDE CRYS ER 20 MEQ PO TBCR
40.0000 meq | EXTENDED_RELEASE_TABLET | Freq: Once | ORAL | Status: AC
  Administered 2015-05-10: 40 meq via ORAL
  Filled 2015-05-10: qty 2

## 2015-05-10 NOTE — Discharge Summary (Signed)
Name: Emily Mann MRN: [id number] DOB: 1995/08/04 19 y.o. PCP: No primary care provider on file.  Date of Admission: 05/06/2015  2:52 AM Date of Discharge: 05/10/2015 Attending Physician: Tyson Alias, MD  Discharge Diagnosis: 1. Acute interstitial pancreatitis  2. Acute complicated cystitis 2/2 Proteus mirabilis Principal Problem:   Pancreatitis, acute Active Problems:   Acute gallstone pancreatitis   Acute cystitis with hematuria  Discharge Medications:   Medication List    TAKE these medications        acetaminophen 500 MG tablet  Commonly known as:  TYLENOL  Take 1 tablet (500 mg total) by mouth every 6 (six) hours as needed for mild pain, moderate pain, fever or headache.     cephALEXin 500 MG capsule  Commonly known as:  KEFLEX  Take 1 capsule (500 mg total) by mouth 2 (two) times daily.     ondansetron 4 MG tablet  Commonly known as:  ZOFRAN  Take 1 tablet (4 mg total) by mouth every 8 (eight) hours as needed for nausea.     oxyCODONE-acetaminophen 5-325 MG tablet  Commonly known as:  PERCOCET/ROXICET  Take 1 tablet by mouth every 6 (six) hours as needed for severe pain.        Disposition and follow-up:   Ms.Sina Kopischke was discharged from [facility] in Good condition.  At the hospital follow up visit please address:  1.  Resolution of nausea, vomiting, abdominal pain; follow-up appointment with GI  2.  Labs / imaging needed at time of follow-up: None  3.  Pending labs/ test needing follow-up: None  Follow-up Appointments:   Discharge Instructions:     Discharge Instructions    Diet - low sodium heart healthy    Complete by:  As directed      Diet - low sodium heart healthy    Complete by:  As directed      Increase activity slowly    Complete by:  As directed      Increase activity slowly    Complete by:  As directed            Consultations: Treatment Team:  Carman Ching, MD  Procedures Performed:    Ct Abdomen Pelvis W Contrast  05/06/2015  ADDENDUM REPORT: 05/06/2015 11:15 ADDENDUM: The impression should read: Changes consistent with mild pancreatitis. Correlation with laboratory values is recommended. Tiny subpleural nodules as described. Electronically Signed   By: Alcide Clever M.D.   On: 05/06/2015 11:15  05/06/2015  CLINICAL DATA:  Abdominal pain with nausea and vomiting EXAM: CT ABDOMEN AND PELVIS WITH CONTRAST TECHNIQUE: Multidetector CT imaging of the abdomen and pelvis was performed using the standard protocol following bolus administration of intravenous contrast. CONTRAST:  OMNIPAQUE IOHEXOL 300 MG/ML  SOLN COMPARISON:  None. FINDINGS: Lung bases are free of acute infiltrate or sizable effusion. Tiny subpleural nodules are identified bilaterally. The liver, spleen, adrenal glands, gallbladder and kidneys are within normal limits. Pancreas is well visualized and demonstrates some peripancreatic inflammatory changes predominately within the head and uncinate process region. The appendix is within normal limits. The bladder is well distended. No acute bony abnormality is noted. IMPRESSION: No acute intra-abdominal abnormality is noted. Tiny subpleural nodules bilaterally. These are likely postinflammatory in nature. If the patient is at high risk for bronchogenic carcinoma, follow-up chest CT at 1 year is recommended. If the patient is at low risk, no follow-up is needed. This recommendation follows the consensus statement: Guidelines for Management of  Small Pulmonary Nodules Detected on CT Scans: A Statement from the Fleischner Society as published in Radiology 2005; 237:395-400. Electronically Signed: By: Alcide CleverMark  Lukens M.D. On: 05/06/2015 08:24   Mr Abdomen Mrcp Wo Cm  05/08/2015  CLINICAL DATA:  19 year old female inpatient admitted with acute pancreatitis. EXAM: MRI ABDOMEN WITHOUT CONTRAST  (INCLUDING MRCP) TECHNIQUE: Multiplanar multisequence MR imaging of the abdomen was performed.  Heavily T2-weighted images of the biliary and pancreatic ducts were obtained, and three-dimensional MRCP images were rendered by post processing. COMPARISON:  05/06/2015 right upper quadrant abdominal sonogram and CT abdomen/pelvis. FINDINGS: Images are significantly limited by motion artifact and by central signal loss due to large body habitus. Lower chest: Mild dependent atelectasis of both lung bases. Hepatobiliary: Normal liver size and configuration. Mild diffuse hepatic steatosis. No liver mass. Normal gallbladder with no cholelithiasis. No biliary ductal dilatation. Common bile duct diameter 5 mm. No filling defects are seen within the bile ducts to suggest choledocholithiasis. No enhancing ampullary or biliary mass. No biliary stricture. Pancreas: There is mild diffuse thickening of the pancreatic parenchyma with mild haziness of the peripancreatic fat, in keeping with mild acute pancreatitis. There are no regions of pancreatic nonenhancement to suggest pancreatic necrosis. No peripancreatic fluid collections. No pancreatic mass or duct dilation. No evidence of pancreas divisum, although assessment for divisum is limited due to limited visualization of the pancreatic duct. Spleen: Normal size. No mass. Adrenals/Urinary Tract: Normal adrenals. No hydronephrosis. Normal kidneys with no renal mass. Stomach/Bowel: Grossly normal stomach. Visualized small and large bowel is normal caliber, with no bowel wall thickening. Vascular/Lymphatic: Normal caliber abdominal aorta. Patent portal, splenic, hepatic and renal veins. No pathologically enlarged lymph nodes in the abdomen. Other: No abdominal ascites or focal fluid collection. Musculoskeletal: No aggressive appearing focal osseous lesions. IMPRESSION: 1. Limited scan. Acute uncomplicated pancreatitis. No evidence of cholelithiasis, biliary ductal dilatation or choledocholithiasis. No evidence of a pancreatic or ampullary mass. 2. Mild diffuse hepatic steatosis.  Electronically Signed   By: Delbert PhenixJason A Poff M.D.   On: 05/08/2015 07:59   Mr 3d Recon At Scanner  05/08/2015  CLINICAL DATA:  19 year old female inpatient admitted with acute pancreatitis. EXAM: MRI ABDOMEN WITHOUT CONTRAST  (INCLUDING MRCP) TECHNIQUE: Multiplanar multisequence MR imaging of the abdomen was performed. Heavily T2-weighted images of the biliary and pancreatic ducts were obtained, and three-dimensional MRCP images were rendered by post processing. COMPARISON:  05/06/2015 right upper quadrant abdominal sonogram and CT abdomen/pelvis. FINDINGS: Images are significantly limited by motion artifact and by central signal loss due to large body habitus. Lower chest: Mild dependent atelectasis of both lung bases. Hepatobiliary: Normal liver size and configuration. Mild diffuse hepatic steatosis. No liver mass. Normal gallbladder with no cholelithiasis. No biliary ductal dilatation. Common bile duct diameter 5 mm. No filling defects are seen within the bile ducts to suggest choledocholithiasis. No enhancing ampullary or biliary mass. No biliary stricture. Pancreas: There is mild diffuse thickening of the pancreatic parenchyma with mild haziness of the peripancreatic fat, in keeping with mild acute pancreatitis. There are no regions of pancreatic nonenhancement to suggest pancreatic necrosis. No peripancreatic fluid collections. No pancreatic mass or duct dilation. No evidence of pancreas divisum, although assessment for divisum is limited due to limited visualization of the pancreatic duct. Spleen: Normal size. No mass. Adrenals/Urinary Tract: Normal adrenals. No hydronephrosis. Normal kidneys with no renal mass. Stomach/Bowel: Grossly normal stomach. Visualized small and large bowel is normal caliber, with no bowel wall thickening. Vascular/Lymphatic: Normal caliber abdominal aorta. Patent portal,  splenic, hepatic and renal veins. No pathologically enlarged lymph nodes in the abdomen. Other: No abdominal  ascites or focal fluid collection. Musculoskeletal: No aggressive appearing focal osseous lesions. IMPRESSION: 1. Limited scan. Acute uncomplicated pancreatitis. No evidence of cholelithiasis, biliary ductal dilatation or choledocholithiasis. No evidence of a pancreatic or ampullary mass. 2. Mild diffuse hepatic steatosis. Electronically Signed   By: Delbert Phenix M.D.   On: 05/08/2015 07:59   Dg Chest Port 1 View  05/07/2015  CLINICAL DATA:  Tachypneic.  Congestion.  Fever. EXAM: PORTABLE CHEST 1 VIEW COMPARISON:  05/06/2015. FINDINGS: Heart size stable. Low lung volumes. Bilateral mild interstitial prominence suggesting mild interstitial pneumonitis. Low lung volumes with basilar atelectasis. No pleural effusion or pneumothorax IMPRESSION: 1. Bilateral mild interstitial prominence suggesting interstitial pneumonitis. 2. Low lung volumes with bibasilar atelectasis. Electronically Signed   By: Maisie Fus  Register   On: 05/07/2015 11:43   Dg Abd Acute W/chest  05/06/2015  CLINICAL DATA:  Centralized abdominal pain, nausea, diarrhea, and vomiting for 2 days. EXAM: DG ABDOMEN ACUTE W/ 1V CHEST COMPARISON:  None. FINDINGS: Shallow inspiration with atelectasis in the lung bases. Normal heart size and pulmonary vascularity. No focal airspace disease or consolidation in the lungs. No blunting of costophrenic angles. No pneumothorax. Mediastinal contours appear intact. Scattered gas and stool in the colon. No small or large bowel distention. No free intra-abdominal air. No abnormal air-fluid levels. No radiopaque stones. Visualized bones appear intact. IMPRESSION: Negative abdominal radiographs.  No acute cardiopulmonary disease. Electronically Signed   By: Burman Nieves M.D.   On: 05/06/2015 03:55   US Abdomen Limited Ruq  05/06/2015  CLINICAL DATA:  Right upper quadrant pain, nausea, vomiting, and diarrhea for 3 days. Elevated liver function tests. EXAM: US ABDOMEN LIMITED - RIGHT UPPER QUADRANT COMPARISON:   None. FINDINGS: Gallbladder: No gallstones or wall thickening visualized. No sonographic Murphy sign noted. Common bile duct: Diameter: 7.2 mm, upper limits of normal. Liver: Visualization is limited due to patient movement and rib shadowing. Suggestion of heterogeneous increased parenchymal echotexture probably due to fatty infiltration. Possible intrahepatic bile duct dilatation. Given history of elevated liver function studies, CT examination of the liver is suggested. IMPRESSION: Heterogeneous parenchymal echotexture changes in the liver with suggestion of intrahepatic bile duct dilatation. Extrahepatic bile ducts are upper limits of normal. Gallbladder is normal. Consider CT for further evaluation. Electronically Signed   By: Burman Nieves M.D.   On: 05/06/2015 04:08   Admission HPI: Ms. Gilden is a 19yo F with no significant PMHx who presents with a 2-day history of generalized crampy abdominal pain with vomiting. She was in her usual state of health until 4 days ago, when she started having URI-like symptoms with subjective fevers, occasionally productive cough with chest congestion, and nausea. Then, 2 days ago, she started having worsening generalized crampy abdominal pain with nausea, vomiting, and diarrhea without association with meals, which did not remit and brought her to the ED. She has not tried anything OTC for her symptoms, and nothing seems to worsen or improve the symptoms. She also has generalized malaise. Otherwise, she denies other symptoms, such as diaphoresis, vision changes, headache, chest pain, shortness of breath, palpitations, abdominal distension, rashes, leg swelling, numbness, weakness, changes in urination, rhinorrhea, sore throat, sick contacts, recent travel, or other issues at this time. Her last menstrual period was 4 weeks ago, cycles are regular q 4-5 weeks. She is not taking OCPs or any other medicines.  Hospital Course by problem list: Principal Problem:  Pancreatitis, acute Active Problems:   Acute gallstone pancreatitis   Acute cystitis with hematuria   1. Acute interstitial pancreatitis - She presented with abdominal pain, N/V/D preceded by viral prodrome. Initially febrile here, otherwise she was hemodynamically stable. Labs show WBC 14, lipase 886 with elevated ALP, AST, ALT and bilirubin concerning for pancreatitis 2/2 biliary obstruction. However, an abdominal US showed possible intrahepatic duct dilation with CBD not markedly dilated but upper limit of normal, gallbladder showing no signs of cholelithiasis. A CT abdomen showed changes consistent with interstitial pancreatitis without other abnormalities. MRCP did not show choledocholithiasis or a CBD stone. It was thought that biliary sludge or a transient stone would be an etiology, as the patient is not taking any medications, has no h/o rapid weight loss, no alcohol abuse, etc. An IgG4 level was checked for autimmune pancreatitis but was normal. She defervesced and her tachypnea and tachycardia gradually improved with IV hydration, pain and nausea control. She was able to fully ambulate and tolerate a regular diet without issue before being discharged.  2. Acute cystitis with hematuria 2/2 Proteus mirabilis - She presented without urinary symptoms, but with fever, leukocytosis, and a UA revealing hematuria, many bacteria, positive nitrites. UCx grew pan-sensitive Proteus mirabilis. Blood cultures were negative. She was initially treated with IV rocephin, transitioned to oral keflex when she was able to tolerate fluids by mouth as her symptoms improved, to complete a 7 day course total.  Discharge Vitals:   BP 125/85 mmHg  Pulse 88  Temp(Src) 97.9 F (36.6 C) (Oral)  Resp 20  Ht 5\' 3"  (1.6 m)  Wt 258 lb 13.1 oz (117.4 kg)  BMI 45.86 kg/m2  SpO2 99%  LMP 04/06/2015  Discharge Labs:  Results for orders placed or performed during the hospital encounter of 05/06/15 (from the past 24  hour(s))  Comprehensive metabolic panel     Status: Abnormal   Collection Time: 05/10/15  3:07 AM  Result Value Ref Range   Sodium 136 135 - 145 mmol/L   Potassium 3.3 (L) 3.5 - 5.1 mmol/L   Chloride 102 101 - 111 mmol/L   CO2 25 22 - 32 mmol/L   Glucose, Bld 92 65 - 99 mg/dL   BUN <5 (L) 6 - 20 mg/dL   Creatinine, Ser 1.61 0.44 - 1.00 mg/dL   Calcium 8.1 (L) 8.9 - 10.3 mg/dL   Total Protein 6.1 (L) 6.5 - 8.1 g/dL   Albumin 1.7 (L) 3.5 - 5.0 g/dL   AST 91 (H) 15 - 41 U/L   ALT 105 (H) 14 - 54 U/L   Alkaline Phosphatase 106 38 - 126 U/L   Total Bilirubin 3.1 (H) 0.3 - 1.2 mg/dL   GFR calc non Af Amer >60 >60 mL/min   GFR calc Af Amer >60 >60 mL/min   Anion gap 9 5 - 15  CBC     Status: Abnormal   Collection Time: 05/10/15  3:07 AM  Result Value Ref Range   WBC 16.3 (H) 4.0 - 10.5 K/uL   RBC 2.51 (L) 3.87 - 5.11 MIL/uL   Hemoglobin 8.5 (L) 12.0 - 15.0 g/dL   HCT 09.6 (L) 04.5 - 40.9 %   MCV 98.4 78.0 - 100.0 fL   MCH 33.9 26.0 - 34.0 pg   MCHC 34.4 30.0 - 36.0 g/dL   RDW 81.1 91.4 - 78.2 %   Platelets 222 150 - 400 K/uL  Magnesium     Status: None   Collection Time: 05/10/15  3:07 AM  Result Value Ref Range   Magnesium 1.7 1.7 - 2.4 mg/dL    Signed: Darrick Huntsman, MD 05/10/2015, 10:26 AM

## 2015-05-10 NOTE — Progress Notes (Signed)
Subjective: Emily Mann says her pain, nausea and vomiting are all much better. She has not required pain medication in over 24 hours and has no pain currently. She had one episode of clear emesis yesterday afternoon with intermittent desaturation and tachycardia, much improved after zofran. She is still fatigued, resting this morning but denies other symptoms. She is much less tachypneic, satting high 90s on room air and tachycardic in the 100s-110s . Denies any chest pain, diaphoresis, palpitations, abdominal distension, changes in urinary or bowel function, or other symptoms at this time.  Objective: Vital signs in last 24 hours: Filed Vitals:   05/09/15 1747 05/09/15 1957 05/10/15 0018 05/10/15 0406  BP: 148/88 142/88 141/83 98/66  Pulse: 115  110 82  Temp: 98.4 F (36.9 C) 98.3 F (36.8 C) 98.6 F (37 C) 98.2 F (36.8 C)  TempSrc: Oral Oral Oral Oral  Resp: 21   24  Height:      Weight:      SpO2: 97%  90% 98%   Weight change:   Intake/Output Summary (Last 24 hours) at 05/10/15 0825 Last data filed at 05/10/15 0800  Gross per 24 hour  Intake   1310 ml  Output   1000 ml  Net    310 ml     Gen: Well-appearing, alert and oriented to person, place, and time HEENT: Oropharynx clear without erythema or exudate.  Neck: No cervical LAD, no thyromegaly or nodules, no JVD noted. CV: Tachycardic rate, regular rhythm, no murmurs, rubs, or gallops Pulmonary: Normal effort, CTA bilaterally, no wheezing, rales, or rhonchi Abdominal: Soft abdomen, moderate tenderness in the epigastrum and RUQ, no rebound, guarding or masses, Murphy's negative. Extremities: Distal pulses 2+ in upper and lower extremities bilaterally, no tenderness, erythema or edema Skin: No atypical appearing moles. No rashes  Lab Results: Basic Metabolic Panel:  Recent Labs Lab 05/09/15 0453 05/10/15 0307  NA 137 136  K 3.3* 3.3*  CL 107 102  CO2 23 25  GLUCOSE 170* 92  BUN <5* <5*  CREATININE 0.73 0.64    CALCIUM 8.1* 8.1*  MG 1.6* 1.7   Liver Function Tests:  Recent Labs Lab 05/09/15 0453 05/10/15 0307  AST 70* 91*  ALT 96* 105*  ALKPHOS 105 106  BILITOT 2.9* 3.1*  PROT 6.1* 6.1*  ALBUMIN 1.8* 1.7*    Recent Labs Lab 05/06/15 0055 05/08/15 0340  LIPASE 886* 55*   CBC:  Recent Labs Lab 05/06/15 0055  05/08/15 0340 05/09/15 0453 05/10/15 0307  WBC 13.8*  < > 12.5* 14.7* 16.3*  NEUTROABS 11.2*  --  8.6*  --   --   HGB 12.3  < > 8.9* 8.6* 8.5*  HCT 34.0*  < > 25.7* 24.9* 24.7*  MCV 94.7  < > 97.7 97.3 98.4  PLT 255  < > 209 223 222  < > = values in this interval not displayed. Fasting Lipid Panel:  Recent Labs Lab 05/06/15 1006  CHOL 149  HDL 30*  LDLCALC 104*  TRIG 76  CHOLHDL 5.0   Urinalysis:  Recent Labs Lab 05/06/15 0056  COLORURINE RED*  LABSPEC >1.046*  PHURINE 5.5  GLUCOSEU NEGATIVE  HGBUR LARGE*  BILIRUBINUR LARGE*  KETONESUR 40*  PROTEINUR >300*  NITRITE POSITIVE*  LEUKOCYTESUR MODERATE*   Micro Results: Recent Results (from the past 240 hour(s))  Urine culture     Status: None   Collection Time: 05/06/15 12:56 AM  Result Value Ref Range Status   Specimen Description URINE, RANDOM  Final   Special Requests NONE  Final   Culture >=100,000 COLONIES/mL PROTEUS MIRABILIS  Final   Report Status 05/09/2015 FINAL  Final   Organism ID, Bacteria PROTEUS MIRABILIS  Final      Susceptibility   Proteus mirabilis - MIC*    AMPICILLIN <=2 SENSITIVE Sensitive     CEFAZOLIN <=4 SENSITIVE Sensitive     CEFTRIAXONE <=1 SENSITIVE Sensitive     CIPROFLOXACIN <=0.25 SENSITIVE Sensitive     GENTAMICIN <=1 SENSITIVE Sensitive     IMIPENEM 2 SENSITIVE Sensitive     NITROFURANTOIN 128 RESISTANT Resistant     TRIMETH/SULFA <=20 SENSITIVE Sensitive     AMPICILLIN/SULBACTAM <=2 SENSITIVE Sensitive     PIP/TAZO <=4 SENSITIVE Sensitive     * >=100,000 COLONIES/mL PROTEUS MIRABILIS  Blood culture (routine x 2)     Status: None (Preliminary result)    Collection Time: 05/06/15 12:43 PM  Result Value Ref Range Status   Specimen Description BLOOD LEFT ANTECUBITAL  Final   Special Requests BOTTLES DRAWN AEROBIC AND ANAEROBIC 5CC  Final   Culture NO GROWTH 3 DAYS  Final   Report Status PENDING  Incomplete  Blood culture (routine x 2)     Status: None (Preliminary result)   Collection Time: 05/06/15 12:52 PM  Result Value Ref Range Status   Specimen Description BLOOD LEFT HAND  Final   Special Requests BOTTLES DRAWN AEROBIC AND ANAEROBIC 5CC  Final   Culture NO GROWTH 3 DAYS  Final   Report Status PENDING  Incomplete  MRSA PCR Screening     Status: None   Collection Time: 05/06/15  8:33 PM  Result Value Ref Range Status   MRSA by PCR NEGATIVE NEGATIVE Final    Comment:        The GeneXpert MRSA Assay (FDA approved for NASAL specimens only), is one component of a comprehensive MRSA colonization surveillance program. It is not intended to diagnose MRSA infection nor to guide or monitor treatment for MRSA infections.    Studies/Results: No results found.Assessment/Plan: Principal Problem:   Pancreatitis, acute Active Problems:   Acute gallstone pancreatitis   Acute cystitis with hematuria 1. Interstitial pancreatitis - 2 day h/o abdominal pain, N/V/D preceded by viral prodrome. Initially febrile here, otherwise hemodynamically stable. Labs show WBC 14, lipase 886 with elevated ALP, AST, ALT and bilirubin concerning for pancreatitis 2/2 biliary obstruction. Abd US shows possible intrahepatic duct dilation with CBD not markedly dilated but upper limit of normal, gallbladder shows no signs of cholelithiasis. CT abdomen shows changes consistent with interstitial pancreatitis. MRCP did not show choledocholithiasis or a CBD stone. Possible that biliary sludge is an etiology, but unclear cause as patient is not taking any medications, has no h/o rapid weight loss, no alcohol abuse, etc. CMP shows improving biliary pattern. -GI following,  appreciate recs -Advance diet as tolerated -IV fluids - MIVF currently D5 1/2 @ 150/hr. Continue additional colloid boluses with NS PRN -F/u IgG4 -BCx NGTD -Repeat CBC, CMP.  -Transition to oral analgesics and antiemetics   2. Acute complicated cystitis from Proteus infection - patient without urinary symptoms, but UA shows hematuria, many bacteria, positive nitrites. UCx positive for Proteus mirabilis, pan-sensitive except for nitrofurantoin. -s/p ceftriaxone x 3days; transitioned to oral keflex today, day 4/7  PPX - DVT: Lovenox  Dispo: Disposition is deferred at this time, awaiting improvement of current medical problems.  Anticipated discharge in approximately 1-3 day(s).   The patient does not have a current PCP (No primary care  provider on file.) and does need an [facility] hospital follow-up appointment after discharge.  The patient does not have transportation limitations that hinder transportation to clinic appointments.   LOS: 4 days   Darrick Huntsman, MD 05/10/2015, 8:25 AM

## 2015-05-11 LAB — CULTURE, BLOOD (ROUTINE X 2)
Culture: NO GROWTH
Culture: NO GROWTH

## 2015-05-25 ENCOUNTER — Encounter: Payer: Self-pay | Admitting: Pulmonary Disease

## 2015-05-25 ENCOUNTER — Ambulatory Visit: Payer: Managed Care, Other (non HMO) | Admitting: Pulmonary Disease

## 2015-12-03 ENCOUNTER — Encounter: Payer: Self-pay | Admitting: Obstetrics

## 2015-12-03 ENCOUNTER — Ambulatory Visit (INDEPENDENT_AMBULATORY_CARE_PROVIDER_SITE_OTHER): Payer: Medicaid Other | Admitting: Obstetrics

## 2015-12-03 VITALS — BP 105/72 | HR 51 | Temp 97.6°F | Ht 63.0 in | Wt 242.0 lb

## 2015-12-03 DIAGNOSIS — Z01419 Encounter for gynecological examination (general) (routine) without abnormal findings: Secondary | ICD-10-CM | POA: Diagnosis not present

## 2015-12-03 DIAGNOSIS — Z113 Encounter for screening for infections with a predominantly sexual mode of transmission: Secondary | ICD-10-CM

## 2015-12-03 DIAGNOSIS — Z3009 Encounter for other general counseling and advice on contraception: Secondary | ICD-10-CM

## 2015-12-03 DIAGNOSIS — Z0000 Encounter for general adult medical examination without abnormal findings: Secondary | ICD-10-CM | POA: Diagnosis not present

## 2015-12-03 NOTE — Progress Notes (Signed)
Subjective:        Emily Mann is a 20 y.o. female here for a routine exam.  Current complaints: None.    Personal health questionnaire:  Is patient [name], have a family history of breast and/or ovarian cancer: no Is there a family history of uterine cancer diagnosed at age < 1950, gastrointestinal cancer, urinary tract cancer, family member who is a [name]: no Is the patient overweight and hypertensive, family history of diabetes, personal history of gestational diabetes, preeclampsia or PCOS: no Is patient over 7055, have PCOS,  family history of premature CHD under age 22, diabetes, smoke, have hypertension or peripheral artery disease:  no At any time, has a partner hit, kicked or otherwise hurt or frightened you?: no Over the past 2 weeks, have you felt down, depressed or hopeless?: no Over the past 2 weeks, have you felt little interest or pleasure in doing things?:no   Gynecologic History Patient's last menstrual period was 11/17/2015 (exact date). Contraception: none Last Pap: none. Results were: none Last mammogram: n/a. Results were: n/a  Obstetric History OB History  Gravida Para Term Preterm AB SAB TAB Ectopic Multiple Living  2 1 1  1     2     # Outcome Date GA Lbr Len/2nd Weight Sex Delivery Anes PTL Lv  2 AB 07/18/14          1 Term 10/30/10 [id number]   F Vag-Spont  N Y      Past Medical History  Diagnosis Date  . Gallstones     Past Surgical History  Procedure Laterality Date  . Wisdom tooth extraction       Current outpatient prescriptions:  .  acetaminophen (TYLENOL) 500 MG tablet, Take 1 tablet (500 mg total) by mouth every 6 (six) hours as needed for mild pain, moderate pain, fever or headache., Disp: 30 tablet, Rfl: 0 No Known Allergies  Social History  Substance Use Topics  . Smoking status: Never Smoker   . Smokeless tobacco: Never Used  . Alcohol Use: 0.6 - 1.2 oz/week    1-2 Standard drinks or equivalent  per week    Family History  Problem Relation Age of Onset  . Liver cancer Maternal Grandmother   . Liver disease Brother       Review of Systems  Constitutional: negative for fatigue and weight loss Respiratory: negative for cough and wheezing Cardiovascular: negative for chest pain, fatigue and palpitations Gastrointestinal: negative for abdominal pain and change in bowel habits Musculoskeletal:negative for myalgias Neurological: negative for gait problems and tremors Behavioral/Psych: negative for abusive relationship, depression Endocrine: negative for temperature intolerance   Genitourinary:negative for abnormal menstrual periods, genital lesions, hot flashes, sexual problems and vaginal discharge Integument/breast: negative for breast lump, breast tenderness, nipple discharge and skin lesion(s)    Objective:       BP 105/72 mmHg  Pulse 51  Temp(Src) 97.6 F (36.4 C)  Ht 5\' 3"  (1.6 m)  Wt 242 lb (109.77 kg)  BMI 42.88 kg/m2  LMP 11/17/2015 (Exact Date) General:   alert  Skin:   no rash or abnormalities  Lungs:   clear to auscultation bilaterally  Heart:   regular rate and rhythm, S1, S2 normal, no murmur, click, rub or gallop  Breasts:   normal without suspicious masses, skin or nipple changes or axillary nodes  Abdomen:  normal findings: no organomegaly, soft, non-tender and no hernia  Pelvis:  External genitalia: normal general appearance Urinary system: urethral meatus  normal and bladder without fullness, nontender Vaginal: normal without tenderness, induration or masses Cervix: normal appearance Adnexa: normal bimanual exam Uterus: anteverted and non-tender, normal size   Lab Review Urine pregnancy test Labs reviewed yes Radiologic studies reviewed no    Assessment:    Healthy female exam.    Contraceptive counseling and advice.  Does not want contraception.    Plan:    Education reviewed: low fat, low cholesterol diet, safe sex/STD prevention,  self breast exams and weight bearing exercise. Contraception: none. Follow up in: 1 year.   No orders of the defined types were placed in this encounter.   Orders Placed This Encounter  Procedures  . CBC with Differential/Platelet  . Comprehensive metabolic panel  . TSH  . HIV antibody  . Hepatitis B surface antigen  . RPR  . Hepatitis C antibody

## 2015-12-04 ENCOUNTER — Other Ambulatory Visit: Payer: Self-pay | Admitting: Obstetrics

## 2015-12-04 LAB — COMPREHENSIVE METABOLIC PANEL WITH GFR
ALT: 38 [IU]/L — ABNORMAL HIGH (ref 0–32)
AST: 34 [IU]/L (ref 0–40)
Albumin/Globulin Ratio: 1.2 (ref 1.2–2.2)
Albumin: 4.2 g/dL (ref 3.5–5.5)
Alkaline Phosphatase: 115 [IU]/L (ref 39–117)
BUN/Creatinine Ratio: 12 (ref 9–23)
BUN: 10 mg/dL (ref 6–20)
Bilirubin Total: 0.4 mg/dL (ref 0.0–1.2)
CO2: 23 mmol/L (ref 18–29)
Calcium: 9.6 mg/dL (ref 8.7–10.2)
Chloride: 100 mmol/L (ref 96–106)
Creatinine, Ser: 0.82 mg/dL (ref 0.57–1.00)
GFR calc Af Amer: 117 mL/min/{1.73_m2}
GFR calc non Af Amer: 102 mL/min/{1.73_m2}
Globulin, Total: 3.6 g/dL (ref 1.5–4.5)
Glucose: 85 mg/dL (ref 65–99)
Potassium: 4.4 mmol/L (ref 3.5–5.2)
Sodium: 139 mmol/L (ref 134–144)
Total Protein: 7.8 g/dL (ref 6.0–8.5)

## 2015-12-04 LAB — CBC WITH DIFFERENTIAL/PLATELET
Basophils Absolute: 0 10*3/uL (ref 0.0–0.2)
Basos: 0 %
EOS (ABSOLUTE): 0.1 10*3/uL (ref 0.0–0.4)
Eos: 2 %
Hematocrit: 37.7 % (ref 34.0–46.6)
Hemoglobin: 12.6 g/dL (ref 11.1–15.9)
Immature Grans (Abs): 0 10*3/uL (ref 0.0–0.1)
Immature Granulocytes: 0 %
Lymphocytes Absolute: 2.3 10*3/uL (ref 0.7–3.1)
Lymphs: 34 %
MCH: 32.8 pg (ref 26.6–33.0)
MCHC: 33.4 g/dL (ref 31.5–35.7)
MCV: 98 fL — ABNORMAL HIGH (ref 79–97)
Monocytes Absolute: 0.5 10*3/uL (ref 0.1–0.9)
Monocytes: 8 %
Neutrophils Absolute: 3.7 10*3/uL (ref 1.4–7.0)
Neutrophils: 56 %
Platelets: 308 10*3/uL (ref 150–379)
RBC: 3.84 x10E6/uL (ref 3.77–5.28)
RDW: 11.8 % — ABNORMAL LOW (ref 12.3–15.4)
WBC: 6.6 10*3/uL (ref 3.4–10.8)

## 2015-12-04 LAB — HEPATITIS B SURFACE ANTIGEN: HEP B S AG: NEGATIVE

## 2015-12-04 LAB — HIV ANTIBODY (ROUTINE TESTING W REFLEX): HIV Screen 4th Generation wRfx: NONREACTIVE

## 2015-12-04 LAB — HEPATITIS C ANTIBODY: Hep C Virus Ab: 0.1 {s_co_ratio} (ref 0.0–0.9)

## 2015-12-04 LAB — TSH: TSH: 0.716 u[IU]/mL (ref 0.450–4.500)

## 2015-12-04 LAB — SYPHILIS: RPR W/REFLEX TO RPR TITER AND TREPONEMAL ANTIBODIES, TRADITIONAL SCREENING AND DIAGNOSIS ALGORITHM: RPR Ser Ql: NONREACTIVE

## 2015-12-06 ENCOUNTER — Other Ambulatory Visit: Payer: Self-pay | Admitting: Obstetrics

## 2015-12-06 DIAGNOSIS — B9689 Other specified bacterial agents as the cause of diseases classified elsewhere: Secondary | ICD-10-CM

## 2015-12-06 DIAGNOSIS — N760 Acute vaginitis: Principal | ICD-10-CM

## 2015-12-06 LAB — PAP IG W/ RFLX HPV ASCU: PAP SMEAR COMMENT: 0

## 2015-12-06 MED ORDER — METRONIDAZOLE 500 MG PO TABS: 500.0000 mg | ORAL_TABLET | Freq: Two times a day (BID) | ORAL | Status: AC

## 2015-12-11 LAB — NUSWAB VG+, CANDIDA 6SP
Atopobium vaginae: HIGH Score — AB
BVAB 2: HIGH Score — AB
CANDIDA ALBICANS, NAA: NEGATIVE
CANDIDA PARAPSILOSIS, NAA: NEGATIVE
CANDIDA TROPICALIS, NAA: NEGATIVE
Candida glabrata, NAA: NEGATIVE
Candida krusei, NAA: NEGATIVE
Candida lusitaniae, NAA: NEGATIVE
Chlamydia trachomatis, NAA: NEGATIVE
MEGASPHAERA 1: HIGH {score} — AB
Neisseria gonorrhoeae, NAA: NEGATIVE
Trich vag by NAA: NEGATIVE

## 2015-12-12 ENCOUNTER — Telehealth: Payer: Self-pay | Admitting: *Deleted

## 2015-12-13 ENCOUNTER — Other Ambulatory Visit: Payer: Self-pay | Admitting: Obstetrics

## 2015-12-13 ENCOUNTER — Encounter: Payer: Self-pay | Admitting: *Deleted

## 2015-12-13 NOTE — Telephone Encounter (Signed)
See phone note for this encounter. 

## 2016-07-23 IMAGING — MR MR MRCP
11 of 21 series · 20 of 48 positions shown · non-contrast
Comparison: 05/06/2015 right upper quadrant abdominal sonogram and
CT abdomen/pelvis.

CLINICAL DATA: 19-year-old female inpatient admitted with acute
pancreatitis.

EXAM:
MRI ABDOMEN WITHOUT CONTRAST  (INCLUDING MRCP)
TECHNIQUE: Multiplanar multisequence MR imaging of the abdomen was performed.
Heavily T2-weighted images of the biliary and pancreatic ducts were
obtained, and three-dimensional MRCP images were rendered by post
processing.

[Series 6: cor ssfse rt · coronal · 6.0mm · 0.78mm/px · 1 of 47 slices shown]
[im 1/47]
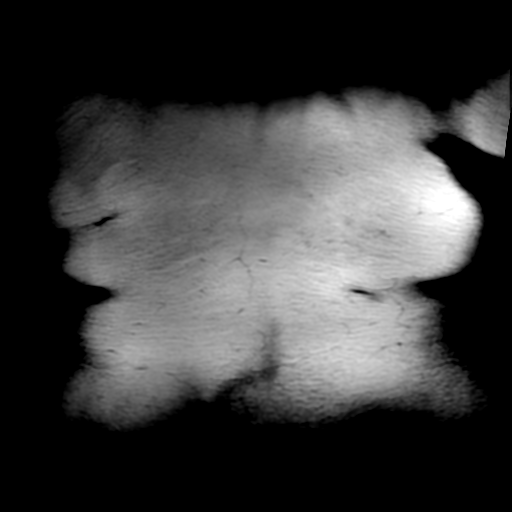

[Series 7: ax ssfse rt · axial · 6.0mm · 0.74mm/px · 1 of 51 slices shown]
[im 1/51]
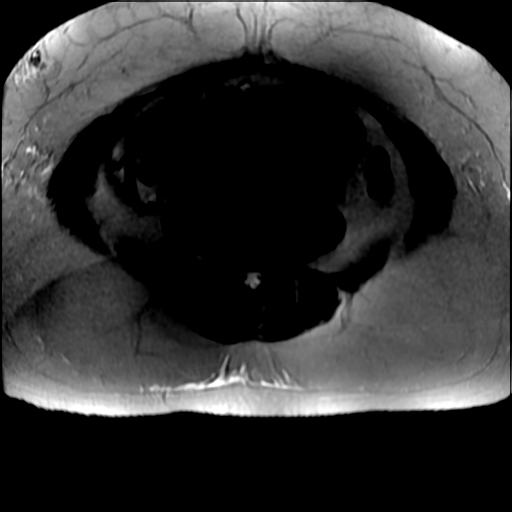

[Series 8: bSSFP · axial · 6.0mm · 0.74mm/px · 1 of 51 slices shown]
[im 1/51]
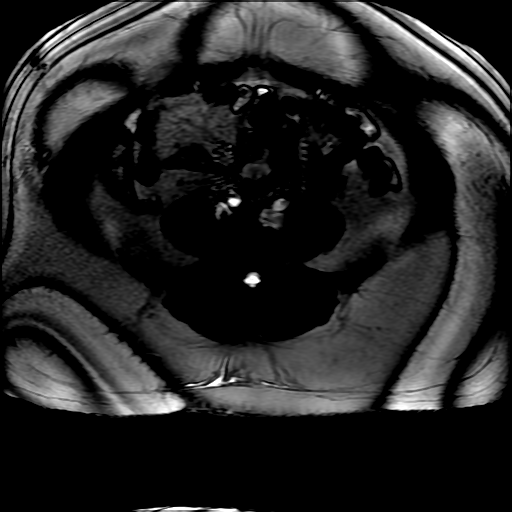

[Series 9: ax ssfse bh · axial · 6.0mm · 0.74mm/px · z∈[-239,+61]mm · 2 of 51 slices shown]
[im 1/51]
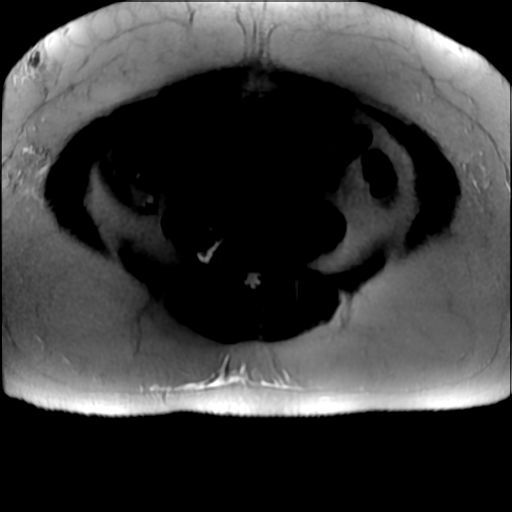
[im 51/51]
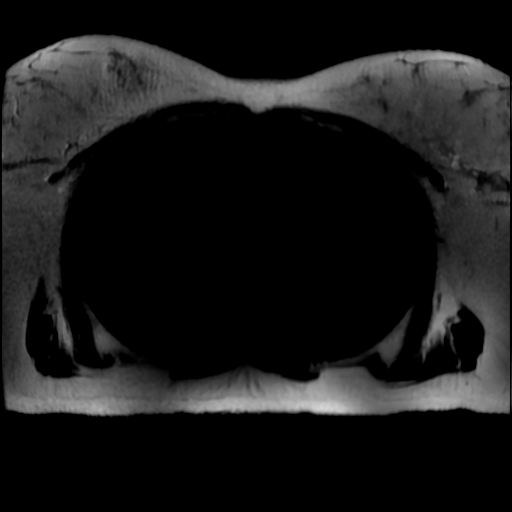

[Series 10: T2 fat-sat · axial · 6.0mm · 0.78mm/px · z∈[-239,+61]mm · 2 of 51 slices shown (1 of 3)]
[im 1/51]
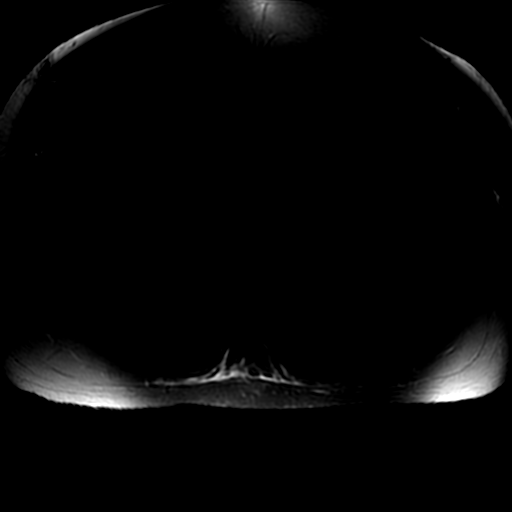
[im 51/51]
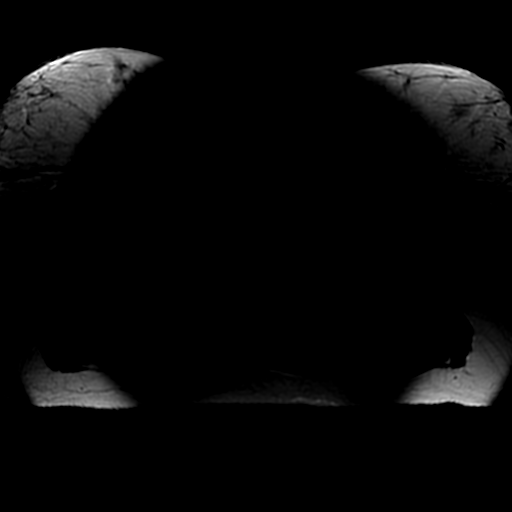

[Series 11: T2 fat-sat · axial · 6.0mm · 0.78mm/px · z∈[-239,+61]mm · 2 of 51 slices shown (2 of 3)]
[im 1/51]
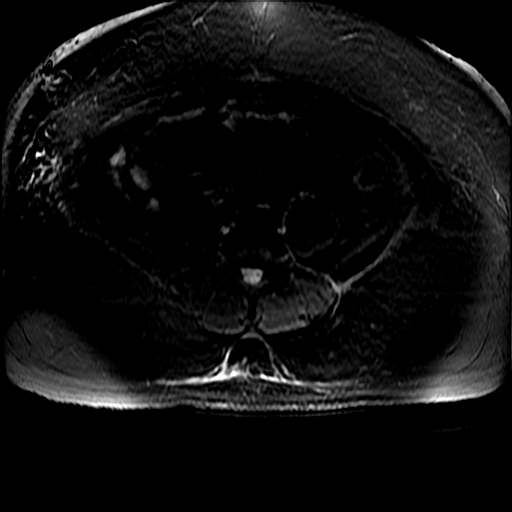
[im 51/51]
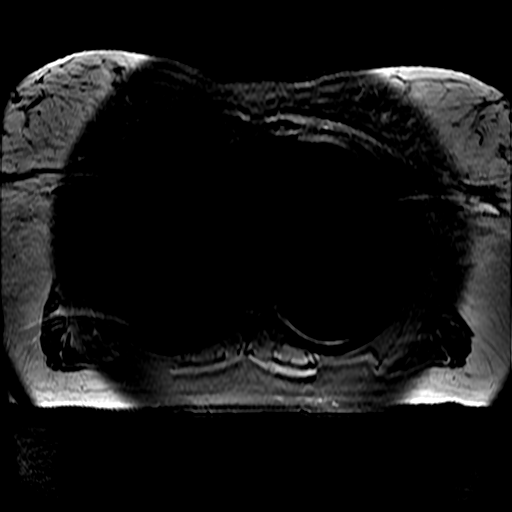

[Series 12: DWI b500 · axial · 8.0mm · 1.56mm/px · z∈[-205,+27]mm · 3 of 90 slices shown]
[im 1/90]
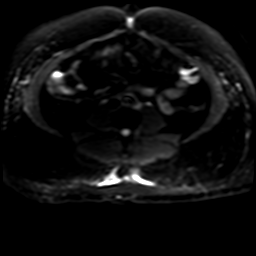
[im 45/90]
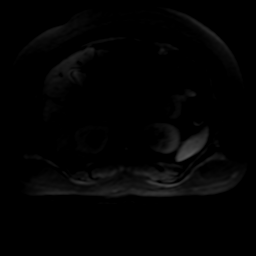
[im 90/90]
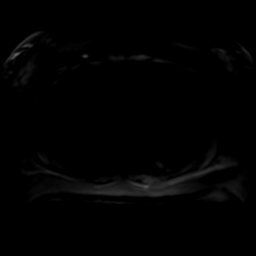

[Series 13: T2 · coronal · 6.0mm · 0.78mm/px · 2 of 47 slices shown]
[im 1/47]
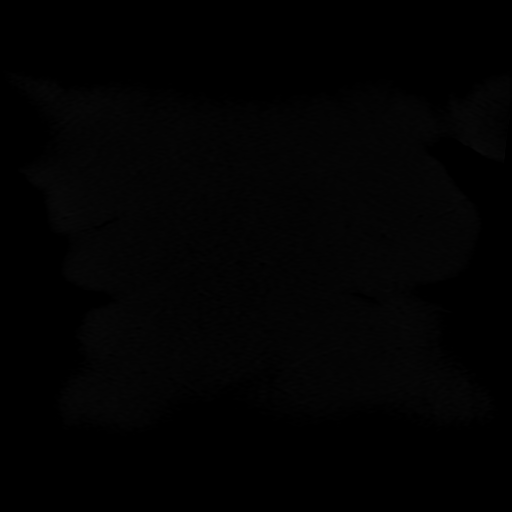
[im 47/47]
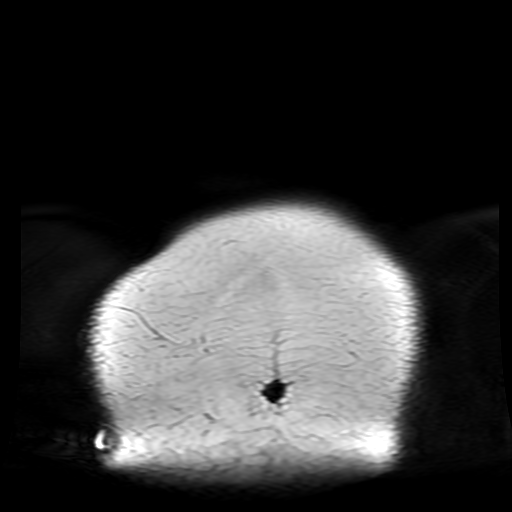

[Series 14: T2 fat-sat · axial · 6.0mm · 0.74mm/px · z∈[-239,+61]mm · 2 of 51 slices shown (3 of 3)]
[im 1/51]
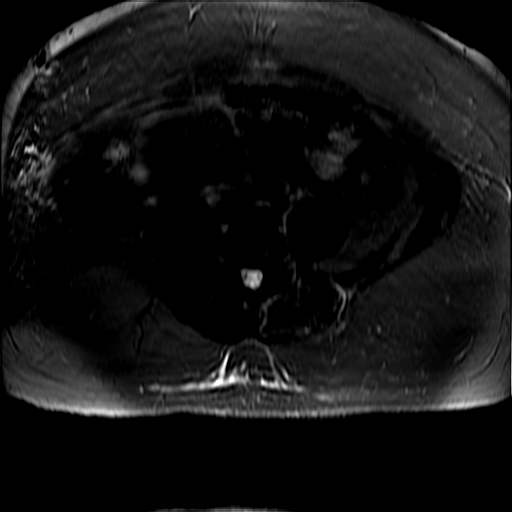
[im 51/51]
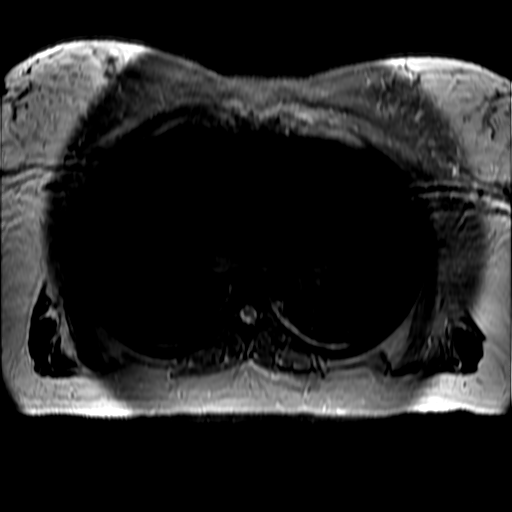

[Series 15: radial 2d thick · sagittal · 50.0mm · 0.74mm/px · 1 of 12 slices shown]
[im 1/12]
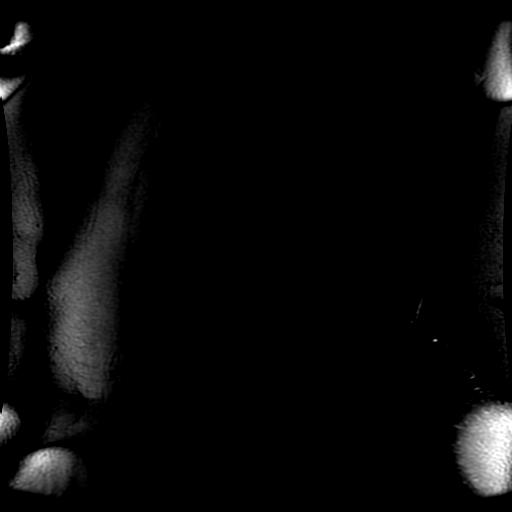

[Series 17: T1 dynamic · axial · 4.0mm · 0.78mm/px · z∈[-249,+59]mm · 3 of 78 slices shown]
[im 1/78]
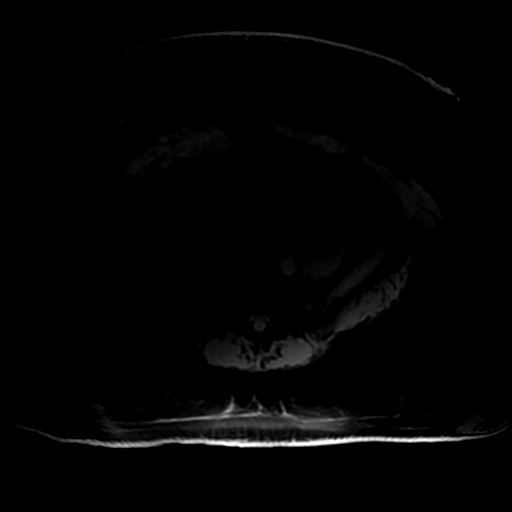
[im 39/78]
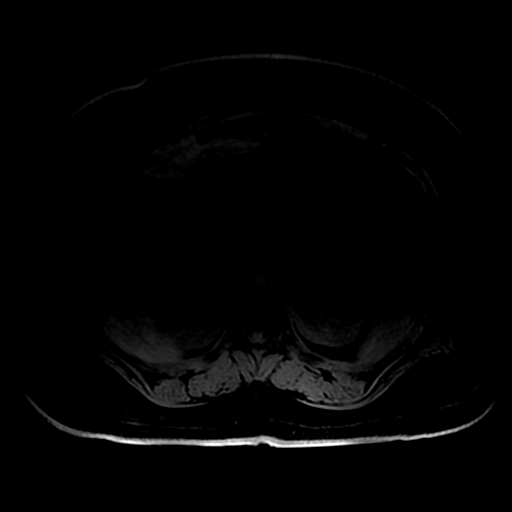
[im 78/78]
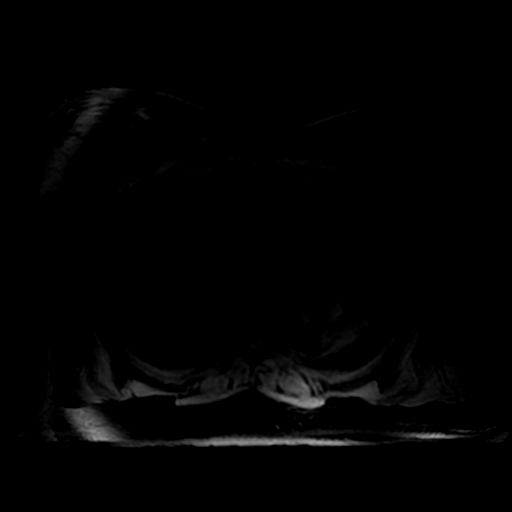

[20 of 48 positions shown; findings below may reference images not displayed]

FINDINGS: Images are significantly limited by motion artifact and by central
signal loss due to large body habitus.

Lower chest: Mild dependent atelectasis of both lung bases.

Hepatobiliary: Normal liver size and configuration. Mild diffuse
hepatic steatosis. No liver mass. Normal gallbladder with no
cholelithiasis. No biliary ductal dilatation. Common bile duct
diameter 5 mm. No filling defects are seen within the bile ducts to
suggest choledocholithiasis. No enhancing ampullary or biliary mass.
No biliary stricture.

Pancreas: There is mild diffuse thickening of the pancreatic
parenchyma with mild haziness of the peripancreatic fat, in keeping
with mild acute pancreatitis. There are no regions of pancreatic
nonenhancement to suggest pancreatic necrosis. No peripancreatic
fluid collections. No pancreatic mass or duct dilation. No evidence
of pancreas divisum, although assessment for divisum is limited due
to limited visualization of the pancreatic duct.

Spleen: Normal size. No mass.

Adrenals/Urinary Tract: Normal adrenals. No hydronephrosis. Normal
kidneys with no renal mass.

Stomach/Bowel: Grossly normal stomach. Visualized small and large
bowel is normal caliber, with no bowel wall thickening.

Vascular/Lymphatic: Normal caliber abdominal aorta. Patent portal,
splenic, hepatic and renal veins. No pathologically enlarged lymph
nodes in the abdomen.

Other: No abdominal ascites or focal fluid collection.

Musculoskeletal: No aggressive appearing focal osseous lesions.
IMPRESSION: 1. Limited scan. Acute uncomplicated pancreatitis. No evidence of
cholelithiasis, biliary ductal dilatation or choledocholithiasis. No
evidence of a pancreatic or ampullary mass.
2. Mild diffuse hepatic steatosis.

## 2017-05-23 ENCOUNTER — Encounter (HOSPITAL_BASED_OUTPATIENT_CLINIC_OR_DEPARTMENT_OTHER): Payer: Self-pay | Admitting: Emergency Medicine

## 2017-05-23 ENCOUNTER — Emergency Department (HOSPITAL_BASED_OUTPATIENT_CLINIC_OR_DEPARTMENT_OTHER)
Admission: EM | Admit: 2017-05-23 | Discharge: 2017-05-23 | Disposition: A | Discharge status: Home / Self Care | Payer: Self-pay | Attending: Emergency Medicine | Admitting: Emergency Medicine

## 2017-05-23 ENCOUNTER — Emergency Department (HOSPITAL_BASED_OUTPATIENT_CLINIC_OR_DEPARTMENT_OTHER): Payer: Self-pay

## 2017-05-23 ENCOUNTER — Other Ambulatory Visit: Payer: Self-pay

## 2017-05-23 DIAGNOSIS — B9789 Other viral agents as the cause of diseases classified elsewhere: Secondary | ICD-10-CM | POA: Insufficient documentation

## 2017-05-23 DIAGNOSIS — R0981 Nasal congestion: Secondary | ICD-10-CM | POA: Insufficient documentation

## 2017-05-23 DIAGNOSIS — J069 Acute upper respiratory infection, unspecified: Secondary | ICD-10-CM | POA: Insufficient documentation

## 2017-05-23 MED ORDER — ALBUTEROL SULFATE HFA 108 (90 BASE) MCG/ACT IN AERS
2.0000 | INHALATION_SPRAY | Freq: Once | RESPIRATORY_TRACT | Status: AC
  Administered 2017-05-23: 2 via RESPIRATORY_TRACT
  Filled 2017-05-23: qty 6.7

## 2017-05-23 NOTE — ED Provider Notes (Signed)
[facility] EMERGENCY DEPARTMENT Provider Note   CSN: [id number] Arrival date & time: 05/23/17  0159     History   Chief Complaint Chief Complaint  Patient presents with  . Cough  . Nasal Congestion    HPI Emily Mann is a 22 y.o. female.  The history is provided by the patient.  Cough  This is a new problem. The current episode started more than 2 days ago. The problem has been gradually worsening. There has been no fever. Associated symptoms include chills and shortness of breath. Treatments tried: OTC meds. The treatment provided no relief.  Patient presents with cough, congestion for over a week She reports mild shortness of breath , No sore throat OTC meds are not helping  Past Medical History:  Diagnosis Date  . Gallstones     Patient Active Problem List   Diagnosis Date Noted  . Acute cystitis with hematuria   . Pancreatitis, acute 05/07/2015  . Acute gallstone pancreatitis     Past Surgical History:  Procedure Laterality Date  . WISDOM TOOTH EXTRACTION      OB History    Gravida Para Term Preterm AB Living   2 1 1   1 2    SAB TAB Ectopic Multiple Live Births           1       Home Medications    Prior to Admission medications   Medication Sig Start Date End Date Taking? Authorizing Provider  acetaminophen (TYLENOL) 500 MG tablet Take 1 tablet (500 mg total) by mouth every 6 (six) hours as needed for mild pain, moderate pain, fever or headache. 05/09/15   Darrick Huntsman, MD  metroNIDAZOLE (FLAGYL) 500 MG tablet Take 1 tablet (500 mg total) by mouth 2 (two) times daily. 12/06/15   Brock Bad, MD    Family History Family History  Problem Relation Age of Onset  . Liver cancer Maternal Grandmother   . Liver disease Brother     Social History Social History   Tobacco Use  . Smoking status: Never Smoker  . Smokeless tobacco: Never Used  Substance Use Topics  . Alcohol use: Yes    Alcohol/week: 0.6 - 1.2 oz    Types: 1  - 2 Standard drinks or equivalent per week  . Drug use: No     Allergies   Patient has no known allergies.   Review of Systems Review of Systems  Constitutional: Positive for chills.  HENT: Positive for congestion.   Respiratory: Positive for cough and shortness of breath.      Physical Exam Updated Vital Signs BP 134/81 (BP Location: Right Arm)   Pulse 94   Temp 98.3 F (36.8 C) (Oral)   Resp 20   Ht 1.6 m (5\' 3" )   Wt 105.2 kg (232 lb)   LMP 03/23/2017   SpO2 97%   BMI 41.10 kg/m   Physical Exam CONSTITUTIONAL: Well developed/well nourished HEAD: Normocephalic/atraumatic EYES: EOMI/PERRL ENMT: Mucous membranes moist, uvula midline no erythema or exudate NECK: supple no meningeal signs SPINE/BACK:entire spine nontender CV: S1/S2 noted, no murmurs/rubs/gallops noted LUNGS: Scattered wheezing bilaterally no apparent distress ABDOMEN: soft, nontender NEURO: Pt is awake/alert/appropriate, moves all extremitiesx4.  No facial droop.   EXTREMITIES: pulses normal/equal, full ROM SKIN: warm, color normal PSYCH: no abnormalities of mood noted, alert and oriented to situation   ED Treatments / Results  Labs (all labs ordered are listed, but only abnormal results are displayed) Labs Reviewed -  No data to display  EKG  EKG Interpretation None       Radiology Dg Chest 2 View  Result Date: 05/23/2017 CLINICAL DATA:  Cough and congestion for 10 days. EXAM: CHEST  2 VIEW COMPARISON:  Chest radiographs 05/07/2015 FINDINGS: Possible bronchial thickening. The cardiomediastinal contours are normal. Pulmonary vasculature is normal. No consolidation, pleural effusion, or pneumothorax. No acute osseous abnormalities are seen. IMPRESSION: Possible bronchial thickening.  No pneumonia. Electronically Signed   By: Rubye OaksMelanie  Ehinger M.D.   On: 05/23/2017 02:34    Procedures Procedures (including critical care time)  Medications Ordered in ED Medications  albuterol (PROVENTIL  HFA;VENTOLIN HFA) 108 (90 Base) MCG/ACT inhaler 2 puff (not administered)     Initial Impression / Assessment and Plan / ED Course  I have reviewed the triage vital signs and the nursing notes.  Pertinent  imaging results that were available during my care of the patient were reviewed by me and considered in my medical decision making (see chart for details).     With likely viral illness, she has minimal wheeze noted, will give albuterol and sent home X-ray is negative for pneumonia  Final Clinical Impressions(s) / ED Diagnoses   Final diagnoses:  Viral URI with cough    ED Discharge Orders    None       Zadie RhineWickline, Taraneh Metheney, MD 05/23/17 [phone]

## 2017-05-23 NOTE — ED Triage Notes (Addendum)
Pt presents with c/o cough for a week and a half and nasal /facial congestion. Pt reports she has been taking over the counter meds without relief.

## 2017-05-23 NOTE — ED Notes (Signed)
Pt verbalizes understanding of d/c instructions and denies any further needs at this time. 

## 2018-08-09 IMAGING — CR DG CHEST 2V
2 series · 2 of 2 positions shown · non-contrast
Comparison: Chest radiographs 05/07/2015

CLINICAL DATA: Cough and congestion for 10 days.

EXAM:
CHEST  2 VIEW

[w chest pa]
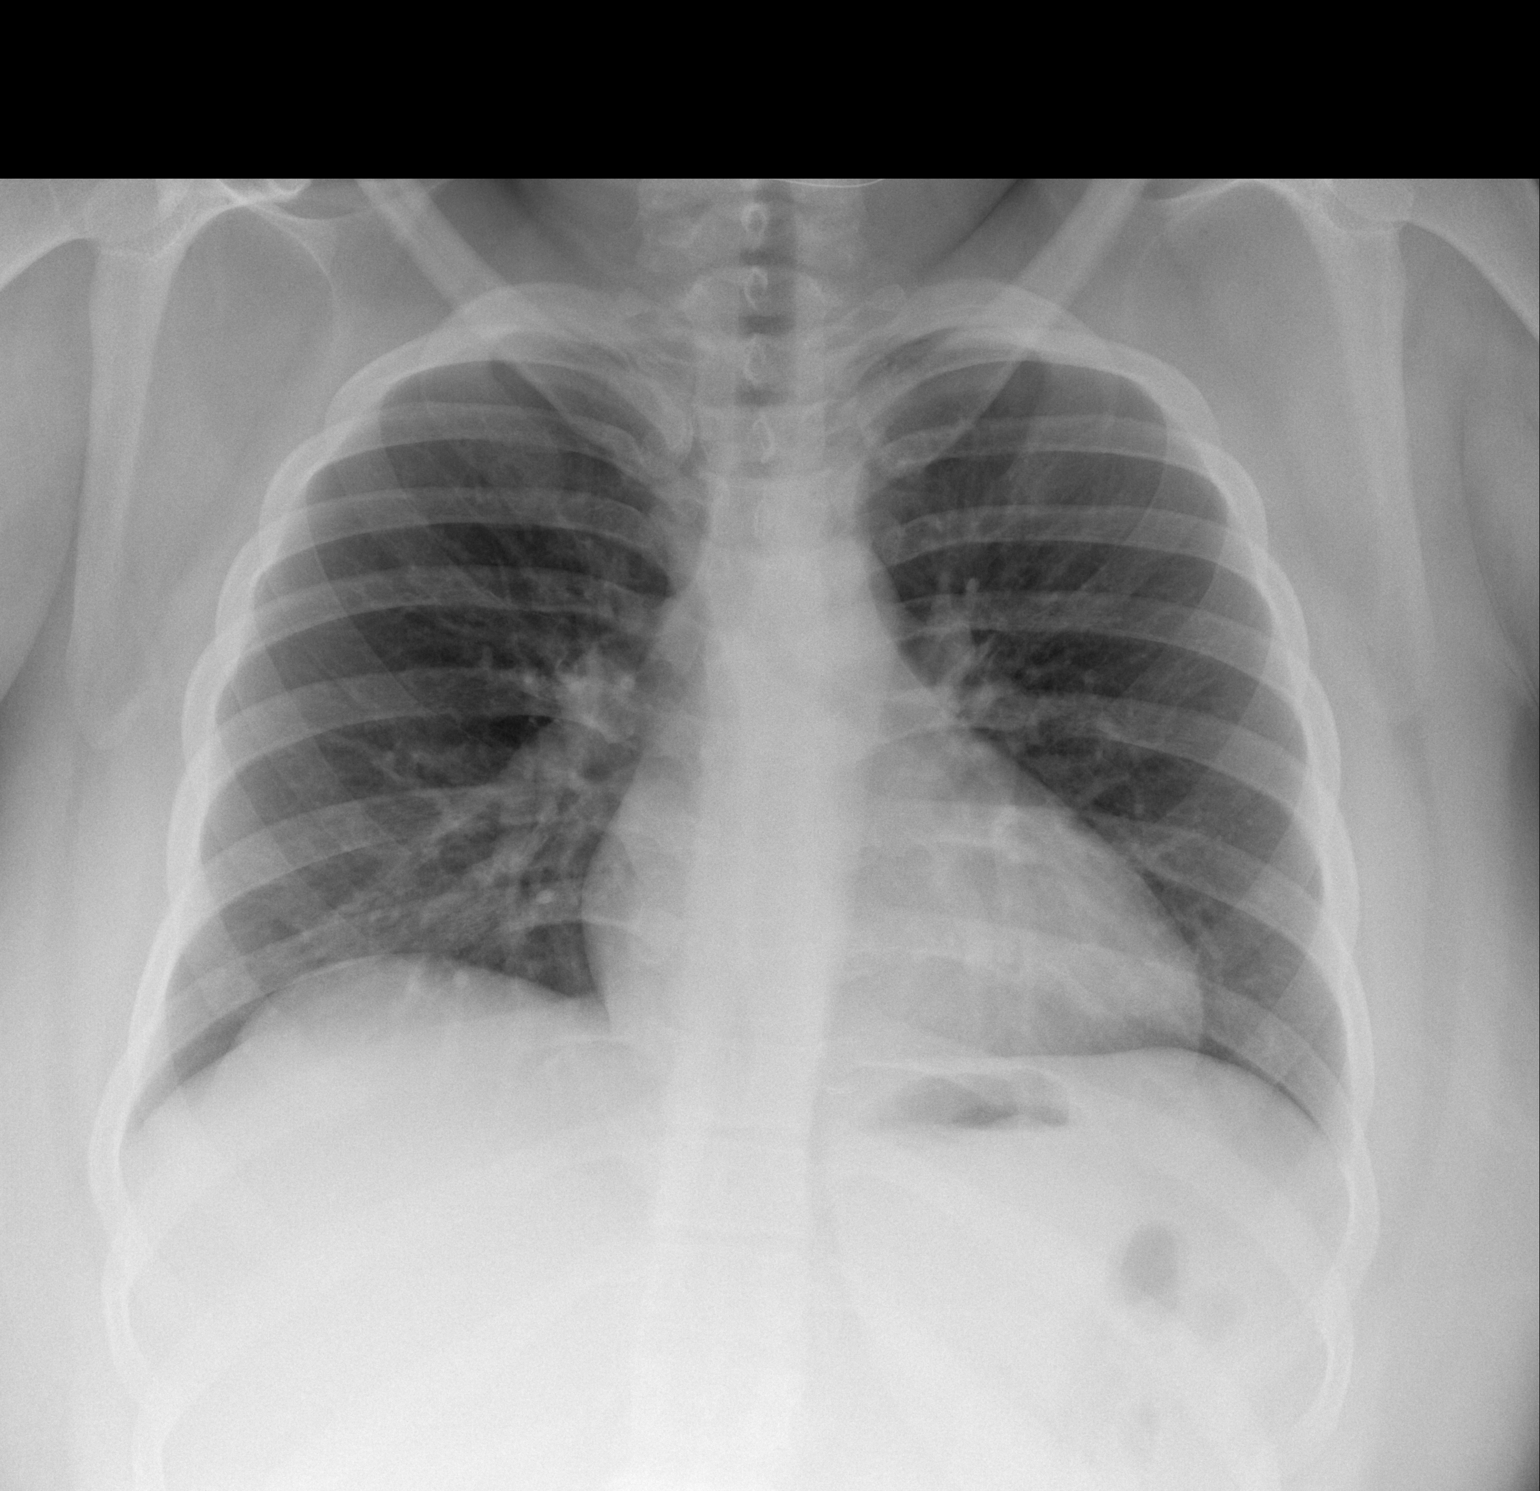

[w chest lat *]
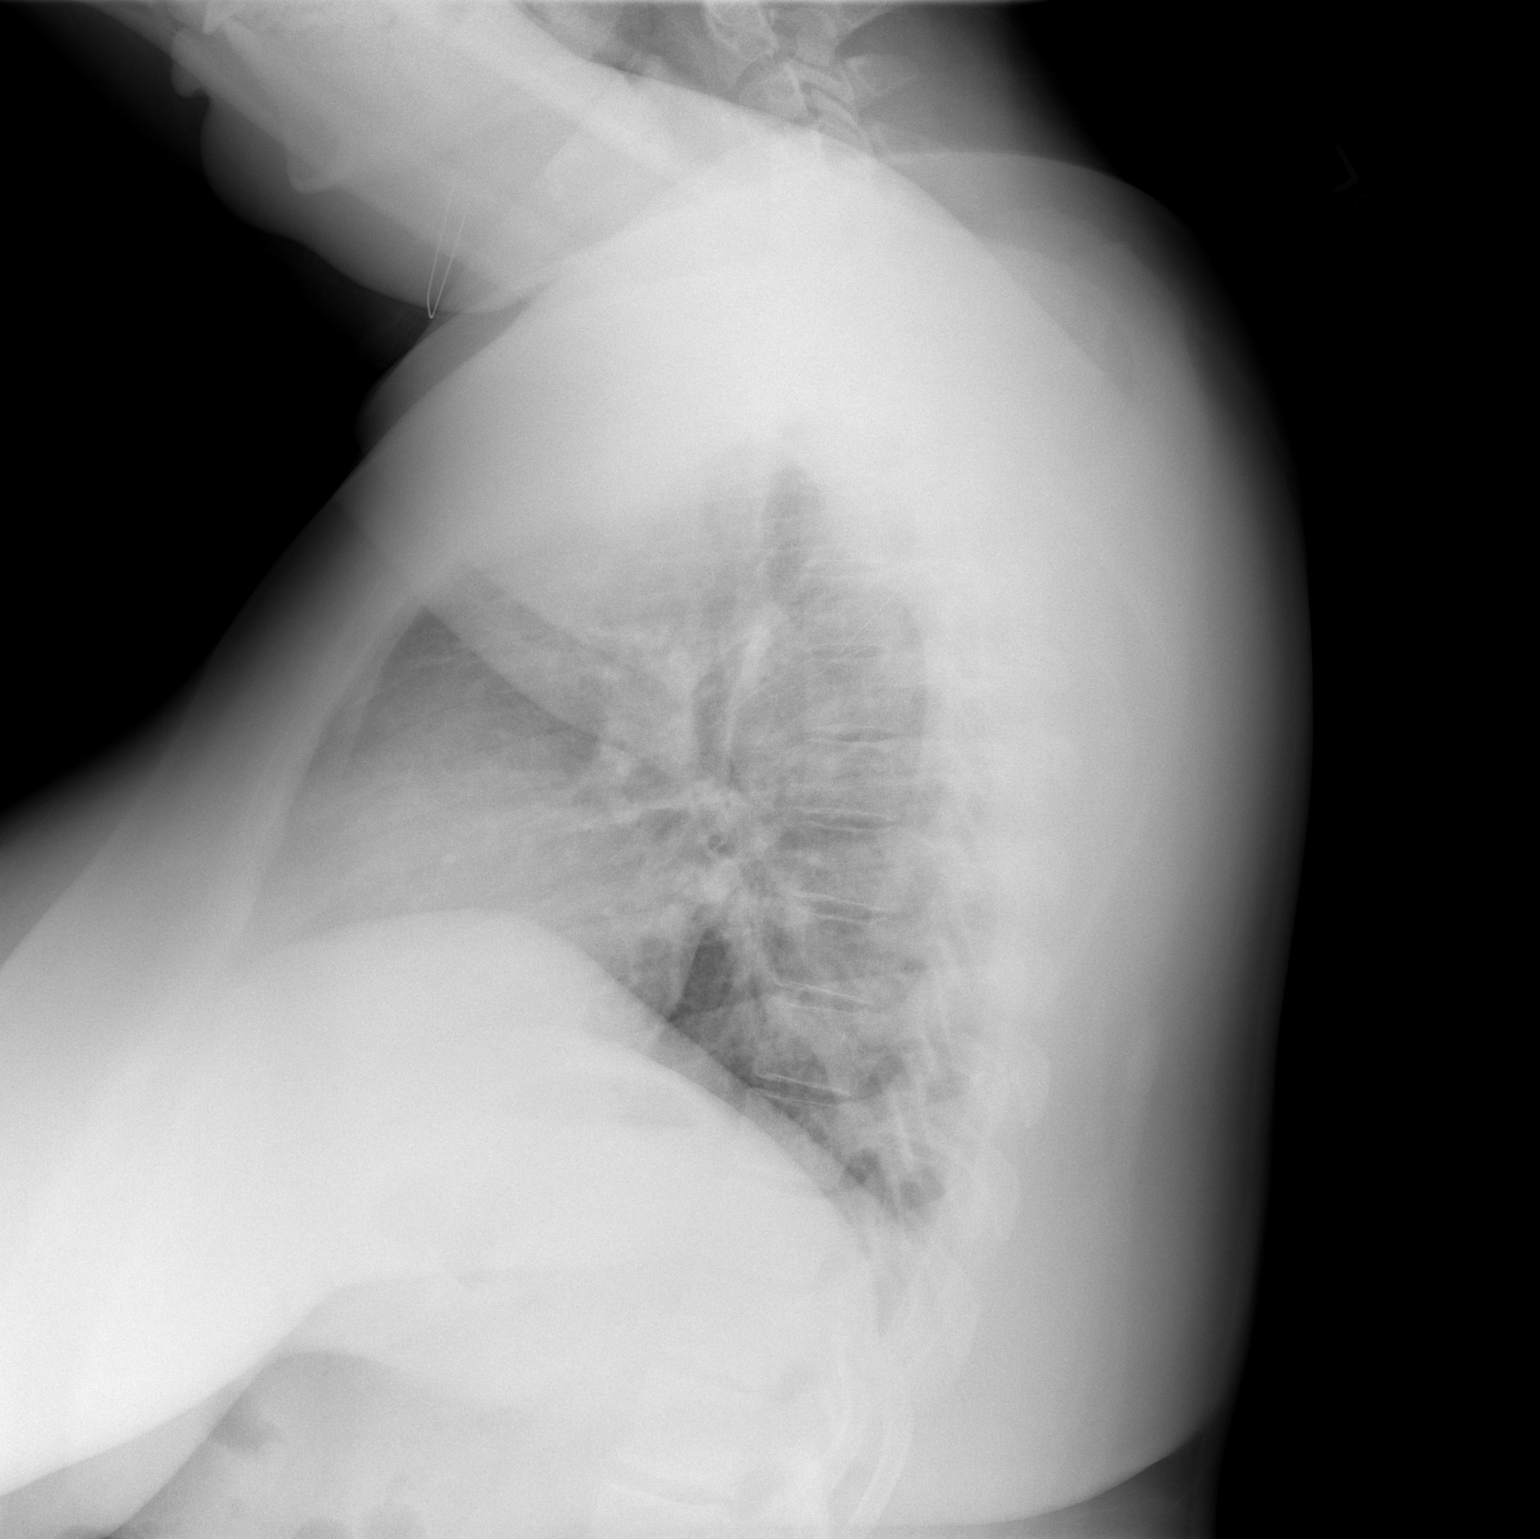

[2 of 2 positions shown; findings below may reference images not displayed]

FINDINGS: Possible bronchial thickening. The cardiomediastinal contours are
normal. Pulmonary vasculature is normal. No consolidation, pleural
effusion, or pneumothorax. No acute osseous abnormalities are seen.
IMPRESSION: Possible bronchial thickening.  No pneumonia.

## 2019-07-25 ENCOUNTER — Emergency Department (HOSPITAL_COMMUNITY)
Admission: EM | Admit: 2019-07-25 | Discharge: 2019-07-25 | Disposition: A | Discharge status: Home / Self Care | Payer: Self-pay | Attending: Emergency Medicine | Admitting: Emergency Medicine

## 2019-07-25 ENCOUNTER — Other Ambulatory Visit: Payer: Self-pay

## 2019-07-25 DIAGNOSIS — Y939 Activity, unspecified: Secondary | ICD-10-CM | POA: Insufficient documentation

## 2019-07-25 DIAGNOSIS — Y9259 Other trade areas as the place of occurrence of the external cause: Secondary | ICD-10-CM | POA: Insufficient documentation

## 2019-07-25 DIAGNOSIS — T1501XA Foreign body in cornea, right eye, initial encounter: Secondary | ICD-10-CM | POA: Insufficient documentation

## 2019-07-25 DIAGNOSIS — Y990 Civilian activity done for income or pay: Secondary | ICD-10-CM | POA: Insufficient documentation

## 2019-07-25 DIAGNOSIS — X58XXXA Exposure to other specified factors, initial encounter: Secondary | ICD-10-CM | POA: Insufficient documentation

## 2019-07-25 DIAGNOSIS — Z79899 Other long term (current) drug therapy: Secondary | ICD-10-CM | POA: Insufficient documentation

## 2019-07-25 MED ORDER — TETRACAINE HCL 0.5 % OP SOLN
2.0000 [drp] | Freq: Once | OPHTHALMIC | Status: AC
  Administered 2019-07-25: 2 [drp] via OPHTHALMIC
  Filled 2019-07-25: qty 4

## 2019-07-25 MED ORDER — GENTAMICIN SULFATE 0.3 % OP SOLN: 2.0000 [drp] | Freq: Four times a day (QID) | OPHTHALMIC | 0 refills | Status: AC

## 2019-07-25 MED ORDER — FLUORESCEIN SODIUM 1 MG OP STRP
1.0000 | ORAL_STRIP | Freq: Once | OPHTHALMIC | Status: AC
  Administered 2019-07-25: 22:00:00 1 via OPHTHALMIC
  Filled 2019-07-25: qty 1

## 2019-07-25 NOTE — Discharge Instructions (Addendum)
Begin using gentamicin drops as prescribed.  Follow up with Ophthalmology in the next two days if not improving, or if symptoms worsen.  The contact information for Dr. Dione Booze has been provided in this discharge summary for you to call and make these arrangements.

## 2019-07-25 NOTE — ED Provider Notes (Signed)
[name]-EMERGENCY DEPT Provider Note   CSN: [id number] Arrival date & time: 07/25/19  1934     History Chief Complaint  Patient presents with  . Eye Pain    Emily Mann is a 24 y.o. female.  Patient is a 24 year old female presenting with a 2-day history of right eye rotation and redness.  She feels as though something is in it.  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  She denies contact use.  The history is provided by the patient.  Eye Pain This is a new problem. The current episode started 2 days ago. The problem occurs constantly. The problem has been gradually worsening. Nothing aggravates the symptoms. Nothing relieves the symptoms. She has tried nothing for the symptoms.       Past Medical History:  Diagnosis Date  . Gallstones     Patient Active Problem List   Diagnosis Date Noted  . Acute cystitis with hematuria   . Pancreatitis, acute 05/07/2015  . Acute gallstone pancreatitis     Past Surgical History:  Procedure Laterality Date  . WISDOM TOOTH EXTRACTION       OB History    Gravida  2   Para  1   Term  1   Preterm      AB  1   Living  2     SAB      TAB      Ectopic      Multiple      Live Births  1           Family History  Problem Relation Age of Onset  . Liver cancer Maternal Grandmother   . Liver disease Brother     Social History   Tobacco Use  . Smoking status: Never Smoker  . Smokeless tobacco: Never Used  Substance Use Topics  . Alcohol use: Yes    Alcohol/week: 1.0 - 2.0 standard drinks    Types: 1 - 2 Standard drinks or equivalent per week  . Drug use: No    Home Medications Prior to Admission medications   Medication Sig Start Date End Date Taking? Authorizing Provider  acetaminophen (TYLENOL) 500 MG tablet Take 1 tablet (500 mg total) by mouth every 6 (six) hours as needed for mild pain, moderate pain, fever or headache. 05/09/15   Darrick Huntsman, MD   metroNIDAZOLE (FLAGYL) 500 MG tablet Take 1 tablet (500 mg total) by mouth 2 (two) times daily. 12/06/15   Brock Bad, MD    Allergies    Patient has no known allergies.  Review of Systems   Review of Systems  Eyes: Positive for pain.  All other systems reviewed and are negative.   Physical Exam Updated Vital Signs BP 134/88 (BP Location: Left Arm)   Pulse 86   Temp 98.4 F (36.9 C) (Oral)   Resp 16   LMP 07/16/2019 (Approximate)   SpO2 98%   Physical Exam Vitals and nursing note reviewed.  Constitutional:      General: She is not in acute distress.    Appearance: Normal appearance. She is not ill-appearing.  HENT:     Head: Normocephalic and atraumatic.  Eyes:     Pupils: Pupils are equal, round, and reactive to light.     Comments: The right eye is noted to have a small foreign body at approximately 8:00.  There is no obvious rust ring.  The lids were everted and there are  no foreign bodies.  Anterior chamber is clear and pupil is reactive.  Pulmonary:     Effort: Pulmonary effort is normal.  Skin:    General: Skin is warm and dry.  Neurological:     Mental Status: She is alert and oriented to person, place, and time.     ED Results / Procedures / Treatments   Labs (all labs ordered are listed, but only abnormal results are displayed) Labs Reviewed - No data to display  EKG None  Radiology No results found.  Procedures Procedures (including critical care time)  Medications Ordered in ED Medications  tetracaine (PONTOCAINE) 0.5 % ophthalmic solution 2 drop (has no administration in time range)  fluorescein ophthalmic strip 1 strip (has no administration in time range)    ED Course  I have reviewed the triage vital signs and the nursing notes.  Pertinent labs & imaging results that were available during my care of the patient were reviewed by me and considered in my medical decision making (see chart for details).    MDM  Rules/Calculators/A&P  The foreign body removed with a tuberculin syringe and patient tolerated this well.  She will be discharged with antibiotic drops and as needed follow-up with ophthalmology.  I see no rust ring.  Patient's exam was somewhat limited secondary to malfunctioning slit lamp.  Apparently the bulb was burned out and the light would not come on.  Final Clinical Impression(s) / ED Diagnoses Final diagnoses:  None    Rx / DC Orders ED Discharge Orders    None       Veryl Speak, MD 07/25/19 2131

## 2019-07-25 NOTE — ED Triage Notes (Signed)
Pt reports eye pain and redness since Friday. Patient denies trauma to the eye but reports it "feels like something is in it." Patient states she "had someone blow in it but they didn't see nothin'" Patient's eye bloodshot and teary.

## 2020-11-08 ENCOUNTER — Encounter (HOSPITAL_BASED_OUTPATIENT_CLINIC_OR_DEPARTMENT_OTHER): Payer: Self-pay | Admitting: Emergency Medicine

## 2023-12-07 LAB — OB RESULTS CONSOLE GC/CHLAMYDIA
Chlamydia: NEGATIVE
Neisseria Gonorrhea: NEGATIVE

## 2023-12-24 LAB — OB RESULTS CONSOLE ANTIBODY SCREEN: Antibody Screen: NEGATIVE

## 2023-12-24 LAB — OB RESULTS CONSOLE ABO/RH: "RH Type ": POSITIVE

## 2023-12-24 LAB — OB RESULTS CONSOLE HIV ANTIBODY (ROUTINE TESTING)
HIV: NONREACTIVE
HIV: NONREACTIVE

## 2023-12-24 LAB — OB RESULTS CONSOLE RUBELLA ANTIBODY, IGM
Rubella: IMMUNE
Rubella: IMMUNE

## 2023-12-24 LAB — OB RESULTS CONSOLE RPR
RPR: NONREACTIVE
RPR: NONREACTIVE

## 2023-12-24 LAB — HEPATITIS C ANTIBODY
HCV Ab: NEGATIVE
HCV Ab: NEGATIVE

## 2023-12-24 LAB — OB RESULTS CONSOLE HEPATITIS B SURFACE ANTIGEN
Hepatitis B Surface Ag: NEGATIVE
Hepatitis B Surface Ag: NEGATIVE

## 2024-05-04 NOTE — Progress Notes (Unsigned)
 Patient was seen for Gestational Diabetes on 05/05/2024  Start time 0920 and End time 1010   Estimated due date: 06/30/2024  Clinical: Medications: Current Medications[1] MVI daily, Fe++ 325 mg every other day Medical History:  Past Medical History:  Diagnosis Date   Gallstones    Labs: OGTT fasting 92, 1 hour 209, 2 hour 212 on 176 on 04/21/2024 A1C: 4.5% obtained 12/24/2023 per referring provider  Dietary and Lifestyle History: Pt presents today for initial assessment alone with her meter kit, strips and lancets. Pt reports she is working full time combined sitting and walking second shift. Pt reports she does the shopping and cooking and lives 30 year old daughter.  Pt reports eating out three times weekly. Pt reports daily intake of sugary sweetened beverages and is open to aim for full omission for optimal blood sugar control. All Pt's questions were answered during this encounter.   Physical Activity: ADL's Stress: 3 out 10 / self care includes: talk to mom Sleep: 8 hours interrupted   24 hr Recall:  First Meal:  SEC on a english muffin, water, or orange juice or sweet tea  Snack:  none Second meal: skips or 2 handfuls of corn chips, ground beef, cheese, lettuce, tomato, sour  cream, apple cherry juice Snack:  none Third meal:  ground beef, cheese, lettuce, tomato, sour cream, 3 hard corn shells, sweet tea or fried chicken, rice, string beans or noodles with red sauce with ground beef, corn or bread, sweet tea or water   Snack:  none Beverages:  water, juice, sweet tea, vitamin water   NUTRITION INTERVENTION  Nutrition education (E-1) on the following topics:   Initial Follow-up  [x]  []  Definition of Gestational Diabetes [x]  []  Why dietary management is important in controlling blood glucose Poorly controlled diabetes can lead to fetal macrosomia, shoulder dystocia and neurological injuries, stillbirth and neonatal complications including respiratory distress syndrome,  hypoglycemia and prolonged NICU admission.  [x]  []  Effects each nutrient has on blood glucose levels [x]  []  Simple carbohydrates vs complex carbohydrates [x]  []  Fluid intake [x]  []  Creating a balanced meal plan [x]  []  Carbohydrate counting  [x]  []  When to check blood glucose levels [x]  []  Proper blood glucose monitoring techniques [x]  []  Effect of stress and stress reduction techniques  [x]  []  Exercise effect on blood glucose levels, appropriate exercise during pregnancy [x]  []  Importance of limiting caffeine and abstaining from alcohol and smoking [x]  []  Medications used for blood sugar control during pregnancy [x]  []  Hypoglycemia and rule of 15 [x]  []  Postpartum self care  Accu chek- Patient has a meter prior to visit. Patient is instructed testing pre breakfast and 2 hours after each meal. CBS: 101 mg/dL, reported as fasting per Pt   Patient instructed to monitor glucose levels: QID FBS: 60 - <= 95 mg/dL; 2 hour: <= 879 mg/dL  Patient received handouts: Nutrition Diabetes and Pregnancy Carbohydrate Counting List Blood glucose log Snack ideas for diabetes during pregnancy Plate Planner  Patient will be seen for follow-up as needed.    [1]  Current Outpatient Medications:    acetaminophen  (TYLENOL ) 500 MG tablet, Take 1 tablet (500 mg total) by mouth every 6 (six) hours as needed for mild pain, moderate pain, fever or headache., Disp: 30 tablet, Rfl: 0   gentamicin  (GARAMYCIN ) 0.3 % ophthalmic solution, Place 2 drops into the right eye 4 (four) times daily., Disp: 5 mL, Rfl: 0   metroNIDAZOLE  (FLAGYL ) 500 MG tablet, Take 1 tablet (500 mg total) by  mouth 2 (two) times daily., Disp: 14 tablet, Rfl: 2

## 2024-05-05 ENCOUNTER — Encounter: Admitting: Dietician

## 2024-05-05 ENCOUNTER — Encounter: Payer: Self-pay | Admitting: Dietician

## 2024-05-05 DIAGNOSIS — O99810 Abnormal glucose complicating pregnancy: Secondary | ICD-10-CM | POA: Insufficient documentation

## 2024-06-02 LAB — OB RESULTS CONSOLE GBS: GBS: POSITIVE

## 2024-06-15 ENCOUNTER — Encounter (HOSPITAL_COMMUNITY): Payer: Self-pay | Admitting: *Deleted

## 2024-06-15 ENCOUNTER — Telehealth (HOSPITAL_COMMUNITY): Payer: Self-pay | Admitting: *Deleted

## 2024-06-17 ENCOUNTER — Encounter (HOSPITAL_COMMUNITY): Payer: Self-pay | Admitting: *Deleted

## 2024-06-17 NOTE — Telephone Encounter (Signed)
 Preadmission screen

## 2024-06-24 ENCOUNTER — Encounter (HOSPITAL_COMMUNITY): Payer: Self-pay | Admitting: Obstetrics and Gynecology

## 2024-06-24 ENCOUNTER — Inpatient Hospital Stay (HOSPITAL_COMMUNITY)

## 2024-06-24 ENCOUNTER — Inpatient Hospital Stay (HOSPITAL_COMMUNITY): Admission: RE | Admit: 2024-06-24 | Source: Home / Self Care | Admitting: Obstetrics

## 2024-06-24 ENCOUNTER — Other Ambulatory Visit: Payer: Self-pay

## 2024-06-24 DIAGNOSIS — O24424 Gestational diabetes mellitus in childbirth, insulin controlled: Principal | ICD-10-CM

## 2024-06-24 LAB — TYPE AND SCREEN
ABO/RH(D): O POS
Antibody Screen: NEGATIVE

## 2024-06-24 LAB — GLUCOSE, CAPILLARY
Glucose-Capillary: 85 mg/dL (ref 70–99)
Glucose-Capillary: 74 mg/dL (ref 70–99)
Glucose-Capillary: 83 mg/dL (ref 70–99)
Glucose-Capillary: 62 mg/dL — ABNORMAL LOW (ref 70–99)
Glucose-Capillary: 62 mg/dL — ABNORMAL LOW (ref 70–99)
Glucose-Capillary: 65 mg/dL — ABNORMAL LOW (ref 70–99)
Glucose-Capillary: 65 mg/dL — ABNORMAL LOW (ref 70–99)

## 2024-06-24 LAB — CBC
HCT: 33.6 % — ABNORMAL LOW (ref 36.0–46.0)
Hemoglobin: 11.4 g/dL — ABNORMAL LOW (ref 12.0–15.0)
MCH: 34 pg (ref 26.0–34.0)
MCHC: 33.9 g/dL (ref 30.0–36.0)
MCV: 100.3 fL — ABNORMAL HIGH (ref 80.0–100.0)
Platelets: 178 10*3/uL (ref 150–400)
RBC: 3.35 MIL/uL — ABNORMAL LOW (ref 3.87–5.11)
RDW: 12.3 % (ref 11.5–15.5)
WBC: 9.5 10*3/uL (ref 4.0–10.5)
nRBC: 0 % (ref 0.0–0.2)

## 2024-06-24 LAB — SYPHILIS: RPR W/REFLEX TO RPR TITER AND TREPONEMAL ANTIBODIES, TRADITIONAL SCREENING AND DIAGNOSIS ALGORITHM: RPR Ser Ql: NONREACTIVE

## 2024-06-24 MED ORDER — SODIUM CHLORIDE 0.9 % IV SOLN
5.0000 10*6.[IU] | Freq: Once | INTRAVENOUS | Status: AC
  Administered 2024-06-24: 5 10*6.[IU] via INTRAVENOUS
  Filled 2024-06-24: qty 5

## 2024-06-24 MED ORDER — OXYTOCIN BOLUS FROM INFUSION: 333.0000 mL | Freq: Once | INTRAVENOUS | Status: AC

## 2024-06-24 MED ORDER — PENICILLIN G POT IN DEXTROSE 60000 UNIT/ML IV SOLN
3.0000 10*6.[IU] | INTRAVENOUS | Status: AC
  Administered 2024-06-24 (×4): 3 10*6.[IU] via INTRAVENOUS
  Filled 2024-06-24 (×4): qty 50

## 2024-06-24 MED ORDER — LACTATED RINGERS IV SOLN: INTRAVENOUS | Status: AC

## 2024-06-24 MED ORDER — SOD CITRATE-CITRIC ACID 500-334 MG/5ML PO SOLN: 30.0000 mL | ORAL | Status: AC | PRN

## 2024-06-24 MED ORDER — ONDANSETRON HCL 4 MG/2ML IJ SOLN: 4.0000 mg | Freq: Four times a day (QID) | INTRAMUSCULAR | Status: AC | PRN

## 2024-06-24 MED ORDER — OXYTOCIN-SODIUM CHLORIDE 30-0.9 UT/500ML-% IV SOLN
1.0000 m[IU]/min | INTRAVENOUS | Status: AC
  Administered 2024-06-24: 2 m[IU]/min via INTRAVENOUS
  Filled 2024-06-24: qty 500

## 2024-06-24 MED ORDER — OXYCODONE-ACETAMINOPHEN 5-325 MG PO TABS: 1.0000 | ORAL_TABLET | ORAL | Status: AC | PRN

## 2024-06-24 MED ORDER — LIDOCAINE HCL (PF) 1 % IJ SOLN: 30.0000 mL | INTRAMUSCULAR | Status: AC | PRN

## 2024-06-24 MED ORDER — TERBUTALINE SULFATE 1 MG/ML IJ SOLN: 0.2500 mg | Freq: Once | INTRAMUSCULAR | Status: AC | PRN

## 2024-06-24 MED ORDER — ACETAMINOPHEN 325 MG PO TABS: 650.0000 mg | ORAL_TABLET | ORAL | Status: AC | PRN

## 2024-06-24 MED ORDER — OXYTOCIN-SODIUM CHLORIDE 30-0.9 UT/500ML-% IV SOLN: 2.5000 [IU]/h | INTRAVENOUS | Status: AC

## 2024-06-24 MED ORDER — MISOPROSTOL 25 MCG QUARTER TABLET
25.0000 ug | ORAL_TABLET | ORAL | Status: AC | PRN
  Administered 2024-06-24 (×2): 25 ug via VAGINAL
  Filled 2024-06-24 (×2): qty 1

## 2024-06-24 MED ORDER — OXYCODONE-ACETAMINOPHEN 5-325 MG PO TABS: 2.0000 | ORAL_TABLET | ORAL | Status: AC | PRN

## 2024-06-24 MED ORDER — LACTATED RINGERS IV SOLN: 500.0000 mL | INTRAVENOUS | Status: AC | PRN

## 2024-06-24 MED ORDER — FENTANYL CITRATE (PF) 100 MCG/2ML IJ SOLN
50.0000 ug | INTRAMUSCULAR | Status: AC | PRN
  Administered 2024-06-24 (×8): 100 ug via INTRAVENOUS
  Filled 2024-06-24 (×8): qty 2

## 2024-06-24 NOTE — Progress Notes (Signed)
 FB out.  Cannot start pitocin  until 1330.  Emily Mann would like to defer SVE until that time. EFM: 130s, moderate variability, category 1

## 2024-06-24 NOTE — Progress Notes (Signed)
 Request from RN to place IUPC due to difficulty tracing contractions  BP (!) 133/90   Pulse 88   Temp 98.2 F (36.8 C) (Oral)   Resp 18   Ht 5' 3 (1.6 m)   Wt (!) 138.4 kg   SpO2 98%   BMI 54.05 kg/m   Toco: not tracing well, IUPC placed EFM: 130s, moderate variability category 1 SVE: 5-6/80/-2, cervix now more midline  [id number] [redacted]w[redacted]d presents with IOL for GMDA2 IOL: s/p misoprostol  x 2 and cervical ripening.  On pitocin , now s/p AROM.  Still in latent labor. IUPC placed GDMA2:  last dose of lantus at bedtime 2/5.  FSBS q4h while in latent labor.  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  Will monitor closely

## 2024-06-24 NOTE — Progress Notes (Signed)
 Feeling consistent cramping vs contractions  BP (!) 141/92   Pulse 95   Temp 98.3 F (36.8 C) (Oral)   Resp 18   Ht 5' 3 (1.6 m)   Wt (!) 138.4 kg   SpO2 98%   BMI 54.05 kg/m    Toco: not tracing well EFM: 130s, moderate variability, category 1 SVE: 5/80/-2, AROM clear fluid  28 y.o. [id number] [redacted]w[redacted]d presents with IOL for GMDA2 IOL: s/p misoprostol  x 2 and cervical ripening.  On pitocin , now s/p AROM.  Still in latent labor GDMA2:  last dose of lantus at bedtime 2/5.  FSBS q4h while in latent labor.  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  Will monitor

## 2024-06-24 NOTE — Progress Notes (Signed)
 Hypoglycemic Event  CBG: 62   Treatment: 8 oz juice/soda  Symptoms: None  Follow-up CBG: Time:1707 CBG Result:62  Follow-up CBG: Time: 1747   CBG Result:65  Possible Reasons for Event: Inadequate meal intake  Comments/MD notified: pt not experiencing any symptoms and has drank 8 oz of juice, an italian icee, and was provided a sprite after last check.    Tinnie KATHEE Champ

## 2024-06-24 NOTE — H&P (Signed)
 29 y.o. [id number] @ [redacted]w[redacted]d presents for induction of labor for GDMA2.  Otherwise has good fetal movement and no bleeding.  Pregnancy complicated by: GMDA2: on lantus 30U and lispro 8U with meals.  Most recent growth US  on 1/15 with EFW 7lb 2oz (58%).  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  No prodromal symptoms Maternal obesity: pre-pregnancy BMI 48. Excessive maternal weight gain this pregnancy at 55lb.   Past Medical History:  Diagnosis Date   Gallstones    Gestational diabetes     Past Surgical History:  Procedure Laterality Date   WISDOM TOOTH EXTRACTION      OB History  Gravida Para Term Preterm AB Living  3 1 1  0 1 1  SAB IAB Ectopic Multiple Live Births  0 0 0  1    # Outcome Date GA Lbr Len/2nd Weight Sex Type Anes PTL Lv  3 Current           2 AB 07/18/14          1 Term 10/30/10 [redacted]w[redacted]d   F Vag-Spont  N LIV    Social History   Socioeconomic History   Marital status: Unknown    Spouse name: Not on file   Number of children: Not on file   Years of education: Not on file   Highest education level: Not on file  Occupational History   Occupation: Product manager  Tobacco Use   Smoking status: Former    Types: Cigarettes   Smokeless tobacco: Never  Vaping Use   Vaping status: Never Used  Substance and Sexual Activity   Alcohol use: Yes    Alcohol/week: 1.0 - 2.0 standard drink of alcohol    Types: 1 - 2 Standard drinks or equivalent per week   Drug use: No   Sexual activity: Yes    Partners: Male    Birth control/protection: Condom  Other Topics Concern   Not on file  Social History Narrative   ** Merged History Encounter **       Social Drivers of Health   Tobacco Use: Medium Risk (06/24/2024)   Patient History    Smoking Tobacco Use: Former    Smokeless Tobacco Use: Never    Passive Exposure: Not on Actuary Strain: Not on file  Food Insecurity: No Food Insecurity (06/24/2024)   Epic    Worried About Brewing Technologist in the Last Year: Never true    Ran Out of Food in the Last Year: Never true  Transportation Needs: No Transportation Needs (06/24/2024)   Epic    Lack of Transportation (Medical): No    Lack of Transportation (Non-Medical): No  Physical Activity: Not on file  Stress: Not on file  Social Connections: Not on file  Intimate Partner Violence: Not At Risk (06/24/2024)   Epic    Fear of Current or Ex-Partner: No    Emotionally Abused: No    Physically Abused: No    Sexually Abused: No  Depression (PHQ2-9): Low Risk (05/05/2024)   Depression (PHQ2-9)    PHQ-2 Score: 0  Alcohol Screen: Not on file  Housing: Low Risk (06/24/2024)   Epic    Unable to Pay for Housing in the Last Year: No    Number of Times Moved in the Last Year: 0    Homeless in the Last Year: No  Utilities: Not At Risk (06/24/2024)   Epic    Threatened with loss of utilities: No  Health Literacy: Not on file   Patient has no known allergies.    Prenatal Transfer Tool  Maternal Diabetes: Yes:  Diabetes Type:  Insulin/Medication controlled Genetic Screening: Normal Maternal Ultrasounds/Referrals: Normal Fetal Ultrasounds or other Referrals:  None Maternal Substance Abuse:  No Significant Maternal Medications:  Meds include: Other: insulin, aspirin, valtrex  Significant Maternal Lab Results: Group B Strep positive Vaccines: decline flu, tdap, RSV  ABO, Rh: --/--/O POS (02/06 0430) Antibody: NEG (02/06 0430) Rubella: Immune, Immune (08/07 0000) RPR: Nonreactive, Nonreactive (08/07 0000)  HBsAg: Negative, Negative (08/07 0000)  HIV: Non-reactive, Non-reactive (08/07 0000)  GBS: Positive/-- (01/15 0000)     Vitals:   06/24/24 0435 06/24/24 0818  BP:  (!) 118/57  Pulse: 98 99  Resp: 18 18  Temp: 98.6 F (37 C) 97.9 F (36.6 C)  SpO2: 98%      General:  NAD Abdomen:  soft, gravid, EFW 7-8# Ex:  1+ edema Perineum / vagina: clear of lesions SVE:  1/50/high, vertex, foley balloon placed and filled with 60  mL fluid FHTs:  130s, moderate variability, category 1 Toco:  quiet   A/P   29 y.o. [id number] [redacted]w[redacted]d presents with IOL for GMDA2 IOL: s/p misoprostol  x 1.  Continued cervical ripening with foley balloon and second dose of misoprostol .  Hoping to avoid epidural GDMA2:  last dose of lantus at bedtime 2/5.  FSBS q4h while in latent labor HSV: asymptomatic, no recent outbreaks, on valtrex GBS positive on penicillin     Korinne Greenstein GEFFEL Lennette Fader
